# Patient Record
Sex: Male | Born: 1955 | Race: White | Hispanic: No | Smoking: Never smoker
Health system: Southern US, Community
[De-identification: ages and names within clinical notes are randomized; demographics above are authoritative.]

## PROBLEM LIST (undated history)

## (undated) DIAGNOSIS — Z8619 Personal history of other infectious and parasitic diseases: Secondary | ICD-10-CM

## (undated) DIAGNOSIS — N23 Unspecified renal colic: Secondary | ICD-10-CM

## (undated) DIAGNOSIS — R7303 Prediabetes: Secondary | ICD-10-CM

## (undated) DIAGNOSIS — M1990 Unspecified osteoarthritis, unspecified site: Secondary | ICD-10-CM

## (undated) DIAGNOSIS — E785 Hyperlipidemia, unspecified: Secondary | ICD-10-CM

## (undated) DIAGNOSIS — I10 Essential (primary) hypertension: Secondary | ICD-10-CM

## (undated) DIAGNOSIS — N189 Chronic kidney disease, unspecified: Secondary | ICD-10-CM

## (undated) DIAGNOSIS — K5790 Diverticulosis of intestine, part unspecified, without perforation or abscess without bleeding: Secondary | ICD-10-CM

## (undated) DIAGNOSIS — G4730 Sleep apnea, unspecified: Secondary | ICD-10-CM

## (undated) DIAGNOSIS — K219 Gastro-esophageal reflux disease without esophagitis: Secondary | ICD-10-CM

## (undated) DIAGNOSIS — N200 Calculus of kidney: Secondary | ICD-10-CM

## (undated) HISTORY — DX: Diverticulosis of intestine, part unspecified, without perforation or abscess without bleeding: K57.90

## (undated) HISTORY — DX: Calculus of kidney: N20.0

## (undated) HISTORY — DX: Essential (primary) hypertension: I10

## (undated) HISTORY — DX: Personal history of other infectious and parasitic diseases: Z86.19

## (undated) HISTORY — DX: Hyperlipidemia, unspecified: E78.5

## (undated) HISTORY — DX: Gastro-esophageal reflux disease without esophagitis: K21.9

## (undated) HISTORY — DX: Prediabetes: R73.03

## (undated) HISTORY — PX: WISDOM TOOTH EXTRACTION: SHX21

## (undated) HISTORY — DX: Unspecified osteoarthritis, unspecified site: M19.90

---
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
## 2008-04-20 ENCOUNTER — Emergency Department (HOSPITAL_COMMUNITY): Admission: EM | Admit: 2008-04-20 | Discharge: 2008-04-20 | Payer: Self-pay | Admitting: Emergency Medicine

## 2008-06-05 ENCOUNTER — Emergency Department: Payer: Self-pay | Admitting: Internal Medicine

## 2008-09-17 ENCOUNTER — Emergency Department: Payer: Self-pay | Admitting: Emergency Medicine

## 2009-01-19 HISTORY — PX: LITHOTRIPSY: SUR834

## 2009-02-14 DIAGNOSIS — E7800 Pure hypercholesterolemia, unspecified: Secondary | ICD-10-CM | POA: Insufficient documentation

## 2009-03-21 ENCOUNTER — Emergency Department: Payer: Self-pay | Admitting: Unknown Physician Specialty

## 2009-03-25 ENCOUNTER — Ambulatory Visit (HOSPITAL_COMMUNITY): Admission: AD | Admit: 2009-03-25 | Discharge: 2009-03-25 | Payer: Self-pay | Admitting: Urology

## 2009-03-26 DIAGNOSIS — N200 Calculus of kidney: Secondary | ICD-10-CM | POA: Insufficient documentation

## 2009-03-26 DIAGNOSIS — G4733 Obstructive sleep apnea (adult) (pediatric): Secondary | ICD-10-CM | POA: Insufficient documentation

## 2009-05-06 ENCOUNTER — Ambulatory Visit: Payer: Self-pay | Admitting: Gastroenterology

## 2009-05-06 LAB — HM COLONOSCOPY

## 2009-08-22 ENCOUNTER — Ambulatory Visit (HOSPITAL_COMMUNITY): Admission: RE | Admit: 2009-08-22 | Discharge: 2009-08-22 | Payer: Self-pay | Admitting: Urology

## 2012-02-13 ENCOUNTER — Emergency Department: Payer: Self-pay | Admitting: Emergency Medicine

## 2012-02-13 LAB — URINALYSIS, COMPLETE
Bilirubin,UR: NEGATIVE
Glucose,UR: NEGATIVE mg/dL (ref 0–75)
Ketone: NEGATIVE
Leukocyte Esterase: NEGATIVE
Protein: 30
RBC,UR: 1479 /HPF (ref 0–5)
Specific Gravity: 1.018 (ref 1.003–1.030)
Squamous Epithelial: NONE SEEN

## 2012-02-13 LAB — BASIC METABOLIC PANEL
Anion Gap: 7 (ref 7–16)
Chloride: 106 mmol/L (ref 98–107)
Co2: 24 mmol/L (ref 21–32)
Creatinine: 1.01 mg/dL (ref 0.60–1.30)
EGFR (African American): >60
EGFR (Non-African Amer.): >60
Glucose: 112 mg/dL — ABNORMAL HIGH (ref 65–99)
Osmolality: 276 (ref 275–301)
Potassium: 3.9 mmol/L (ref 3.5–5.1)

## 2012-02-13 LAB — CBC
HCT: 48.1 % (ref 40.0–52.0)
HGB: 15.6 g/dL (ref 13.0–18.0)
MCH: 25.7 pg — ABNORMAL LOW (ref 26.0–34.0)
MCHC: 32.4 g/dL (ref 32.0–36.0)
MCV: 79 fL — ABNORMAL LOW (ref 80–100)
Platelet: 241 10*3/uL (ref 150–440)
RBC: 6.07 10*6/uL — ABNORMAL HIGH (ref 4.40–5.90)
RDW: 13.6 % (ref 11.5–14.5)
WBC: 14.7 10*3/uL — ABNORMAL HIGH (ref 3.8–10.6)

## 2012-02-15 ENCOUNTER — Other Ambulatory Visit: Payer: Self-pay | Admitting: Urology

## 2012-02-16 ENCOUNTER — Encounter (HOSPITAL_COMMUNITY): Payer: Self-pay | Admitting: *Deleted

## 2012-02-16 NOTE — Pre-Procedure Instructions (Signed)
Asked to bring blue folder the day of the procedure,insurance card,I.D. driver's license,wear comfortable clothing and have a driver for the day. Asked not to take Advil,Motrin,Ibuprofen,Aleve or any NSAIDS, Aspirin, or Toradol for 72 hours prior to procedure,  No vitamins or herbal medications 7 days prior to procedure. Instructed to take laxative per doctor's office instructions and eat a light dinner the evening before procedure.   To arrive at1415  for lithotripsy procedure.  

## 2012-02-19 ENCOUNTER — Encounter (HOSPITAL_COMMUNITY): Payer: Self-pay | Admitting: Pharmacist

## 2012-02-21 MED ORDER — CIPROFLOXACIN HCL 500 MG PO TABS
500.0000 mg | ORAL_TABLET | ORAL | Status: AC
  Administered 2012-02-22: 500 mg via ORAL
  Filled 2012-02-21: qty 1

## 2012-02-22 ENCOUNTER — Ambulatory Visit (HOSPITAL_COMMUNITY): Payer: Managed Care, Other (non HMO)

## 2012-02-22 ENCOUNTER — Ambulatory Visit (HOSPITAL_COMMUNITY)
Admission: RE | Admit: 2012-02-22 | Discharge: 2012-02-22 | Disposition: A | Discharge status: Home / Self Care | Payer: Managed Care, Other (non HMO) | Source: Ambulatory Visit | Attending: Urology | Admitting: Urology

## 2012-02-22 ENCOUNTER — Encounter (HOSPITAL_COMMUNITY)
Admission: RE | Disposition: A | Discharge status: Home / Self Care | Payer: Self-pay | Source: Ambulatory Visit | Attending: Urology

## 2012-02-22 ENCOUNTER — Encounter (HOSPITAL_COMMUNITY): Payer: Self-pay | Admitting: *Deleted

## 2012-02-22 DIAGNOSIS — N201 Calculus of ureter: Secondary | ICD-10-CM | POA: Insufficient documentation

## 2012-02-22 DIAGNOSIS — G4730 Sleep apnea, unspecified: Secondary | ICD-10-CM | POA: Insufficient documentation

## 2012-02-22 DIAGNOSIS — K219 Gastro-esophageal reflux disease without esophagitis: Secondary | ICD-10-CM | POA: Insufficient documentation

## 2012-02-22 DIAGNOSIS — E7800 Pure hypercholesterolemia, unspecified: Secondary | ICD-10-CM | POA: Insufficient documentation

## 2012-02-22 HISTORY — DX: Unspecified renal colic: N23

## 2012-02-22 HISTORY — DX: Sleep apnea, unspecified: G47.30

## 2012-02-22 HISTORY — DX: Chronic kidney disease, unspecified: N18.9

## 2012-02-22 SURGERY — LITHOTRIPSY, ESWL
Anesthesia: LOCAL | Laterality: Right

## 2012-02-22 MED ORDER — DIPHENHYDRAMINE HCL 25 MG PO CAPS
25.0000 mg | ORAL_CAPSULE | ORAL | Status: AC
  Administered 2012-02-22: 25 mg via ORAL
  Filled 2012-02-22: qty 1

## 2012-02-22 MED ORDER — SODIUM CHLORIDE 0.9 % IJ SOLN: 3.0000 mL | INTRAMUSCULAR | Status: DC | PRN

## 2012-02-22 MED ORDER — ACETAMINOPHEN 325 MG PO TABS: 650.0000 mg | ORAL_TABLET | ORAL | Status: DC | PRN

## 2012-02-22 MED ORDER — SODIUM CHLORIDE 0.9 % IV SOLN: 250.0000 mL | INTRAVENOUS | Status: DC | PRN

## 2012-02-22 MED ORDER — OXYCODONE HCL 5 MG PO TABS: 5.0000 mg | ORAL_TABLET | ORAL | Status: DC | PRN

## 2012-02-22 MED ORDER — DIAZEPAM 5 MG PO TABS
10.0000 mg | ORAL_TABLET | ORAL | Status: AC
  Administered 2012-02-22: 10 mg via ORAL
  Filled 2012-02-22: qty 2

## 2012-02-22 MED ORDER — DEXTROSE-NACL 5-0.45 % IV SOLN
INTRAVENOUS | Status: DC
  Administered 2012-02-22: 1000 mL via INTRAVENOUS

## 2012-02-22 MED ORDER — ONDANSETRON HCL 4 MG/2ML IJ SOLN: 4.0000 mg | Freq: Four times a day (QID) | INTRAMUSCULAR | Status: DC | PRN

## 2012-02-22 MED ORDER — FENTANYL CITRATE 0.05 MG/ML IJ SOLN: 25.0000 ug | INTRAMUSCULAR | Status: DC | PRN

## 2012-02-22 MED ORDER — ACETAMINOPHEN 650 MG RE SUPP
650.0000 mg | RECTAL | Status: DC | PRN
  Filled 2012-02-22: qty 1

## 2012-02-22 MED ORDER — SODIUM CHLORIDE 0.9 % IJ SOLN: 3.0000 mL | Freq: Two times a day (BID) | INTRAMUSCULAR | Status: DC

## 2012-02-22 NOTE — H&P (Signed)
ctive Problems Problems  1. Nephrolithiasis Of Both Kidneys 592.0 2. Ureteral Stone Right 592.1  History of Present Illness  Tyrone Wang is a former patient with a history of stones.  He had right ESWL in 2011 and was stone free after therapy.  He returns now with the onset Saturday morning of severe right flank pain.  He had gross hematuria 3 days prior to the pain.  He has had no voiding symptoms.  He passed a stone a couple of months ago, but only a few small bits prior to that.  He was seen in the [place] ER and was found to have a 6 mm stone on the CT which he brought today.  He continues to have pain.   Past Medical History Problems  1. History of  Adult Sleep Apnea 780.57 2. History of  Arthritis V13.4 3. History of  Esophageal Reflux 530.81 4. History of  Hypercholesterolemia 272.0 5. History of  Nephrolithiasis V13.01  Surgical History Problems  1. History of  Lithotripsy 2. History of  Lithotripsy 3. History of  Lithotripsy  Current Meds 1. Fish Oil CAPS; Therapy: (Recorded:25Apr2011) to 2. Multi-Vitamin TABS; Therapy: (Recorded:25Apr2011) to 3. Percocet TABS; Therapy: (Recorded:27Jan2014) to  Allergies Medication  1. No Known Drug Allergies  Family History Problems  1. Maternal history of  Blood In Urine 2. Fraternal history of  Family Health Status - Father's Age age 69 3. Fraternal history of  Family Health Status - Mother's Age age 2 4. Maternal history of  Nephrolithiasis 5. Fraternal history of  Nephrolithiasis Denied  6. Fraternal history of  Family Health Status Number Of Children  Social History Problems  1. Marital History - Divorced V61.03 2. Never A Smoker 3. 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 History of  Alcohol Use 5. History of  Caffeine Use 6. History of  Tobacco Use  Review of Systems Genitourinary, constitutional, skin, eye, otolaryngeal, hematologic/lymphatic, cardiovascular, pulmonary, endocrine, musculoskeletal,  gastrointestinal, neurological and psychiatric system(s) were reviewed and pertinent findings if present are noted.  Genitourinary: hematuria.  Gastrointestinal: nausea, flank pain and heartburn.  Constitutional: fever (low grade. ).    Vitals Vital Signs [Data Includes: Last 1 Day]  27Jan2014 02:31PM  BMI Calculated: 27.4 BSA Calculated: 2 Height: 5 ft 9 in Weight: 185 lb  Blood Pressure: 153 / 98 Temperature: 99.1 F Heart Rate: 80  Physical Exam Constitutional: Well nourished and well developed . No acute distress.  Pulmonary: No respiratory distress and normal respiratory rhythm and effort.  Cardiovascular: Heart rate and rhythm are normal . No peripheral edema.  Abdomen: The abdomen is soft and nontender. No masses are palpated. Moderate tenderness in the RLQ is present. moderate right CVA tenderness no CVA tenderness. No hernias are palpable. No hepatosplenomegaly noted.    Results/Data Urine [Data Includes: Last 1 Day]   27Jan2014  COLOR YELLOW   APPEARANCE CLEAR   SPECIFIC GRAVITY 1.010   pH 6.5   GLUCOSE NEG mg/dL  BILIRUBIN NEG   KETONE NEG mg/dL  BLOOD SMALL   PROTEIN NEG mg/dL  UROBILINOGEN 0.2 mg/dL  NITRITE NEG   LEUKOCYTE ESTERASE NEG   SQUAMOUS EPITHELIAL/HPF NONE SEEN   WBC 0-2 WBC/hpf  RBC 7-10 RBC/hpf  BACTERIA NONE SEEN   CRYSTALS NONE SEEN   CASTS NONE SEEN    The following images/tracing/specimen were independently visualized:  I have reviewed his CT films from the ER. His KUB today shows no change in the position of the left proximal ureteral stone. He  has LUP stones but the right renal stones are not readily apparent. The film is otherwise unremarkable.    Assessment Assessed  1. Ureteral Stone Right 592.1 2. Nephrolithiasis Of Both Kidneys 592.0   He has bilateral renal stones with a symptomatic 6mm right proximal stone.   Plan Health Maintenance (V70.0)  1. UA With REFLEX  Done: 27Jan2014 02:08PM Ureteral Stone (592.1)  2.  HYDROmorphone HCl 2 MG Oral Tablet; One to two po q 4-6 hours prn pain; Therapy:  27Jan2014 to (Evaluate:03Feb2014); Last Rx:27Jan2014 3. Morphine Sulfate 15 MG/ML Injection Solution; INJECT 10  MG Intramuscular; To Be Done:  27Jan2014; Status: HOLD FOR - Administration 4. Promethazine HCl 25 MG Rectal Suppository; INSERT 1 SUPP Every  6 hours PRN; Therapy:  27Jan2014 to (Last Rx:27Jan2014) 5. Promethazine HCl 25 MG/ML Injection Solution; INJECT 25 MG Intramuscular; To Be Done:  27Jan2014; Status: HOLD FOR - Administration 6. KUB  Done: 27Jan2014 12:00AM 7. Follow-up Schedule Surgery Office  Follow-up  Requested for: 27Jan2014   He got relief with morphine 10mg  and phenergan 25mg  IM today and would like to avoid a stent. I am going to give him dilaudid and phenergan suppositories and after discussing the options will set him up for ESWL later this week.  I reviewed the risks in detail and he is quite familiar with them.

## 2012-10-07 ENCOUNTER — Ambulatory Visit: Payer: Self-pay | Admitting: Gastroenterology

## 2012-11-25 ENCOUNTER — Ambulatory Visit: Payer: Self-pay | Admitting: Gastroenterology

## 2012-11-25 HISTORY — PX: UPPER GASTROINTESTINAL ENDOSCOPY: SHX188

## 2012-11-28 LAB — PATHOLOGY REPORT

## 2013-06-21 ENCOUNTER — Ambulatory Visit: Payer: Self-pay | Admitting: Family Medicine

## 2014-01-25 IMAGING — CT CT ABD-PELV W/O CM
1 of 2 series · 15 of 32 positions shown, 19 images · non-contrast
Comparison: none

REASON FOR EXAM: abd pain   bloating  left kidney stone ORAL CONTRAST
COMMENTS:

[Series 2: abd 3mm wo 3.0 i40f 3 · axial · 0.76mm/px · z∈[-1083,-684]mm · 15 of 147 slices shown, 19 images]
[im 7/147  soft-tissue]
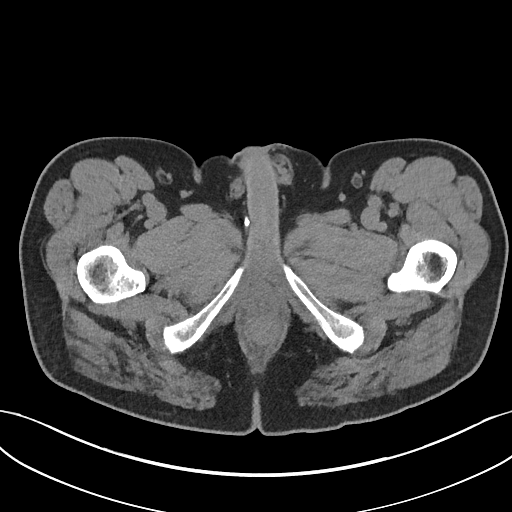
[im 7/147  bone]
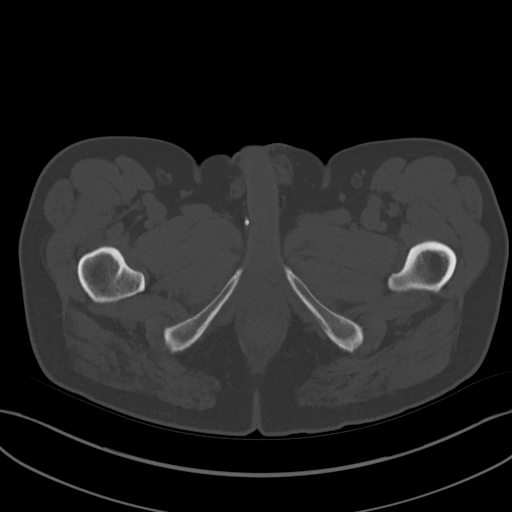
[im 19/147  soft-tissue]
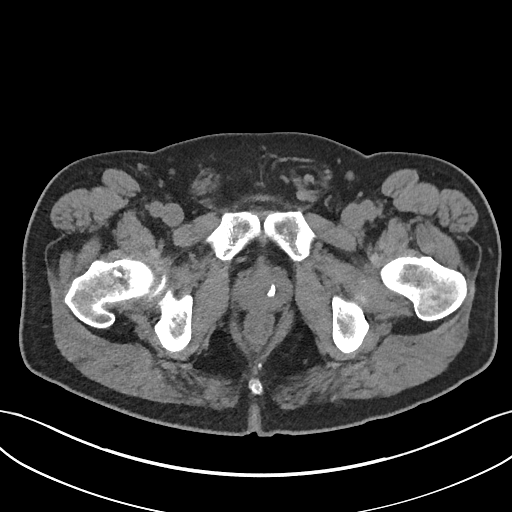
[im 31/147  soft-tissue]
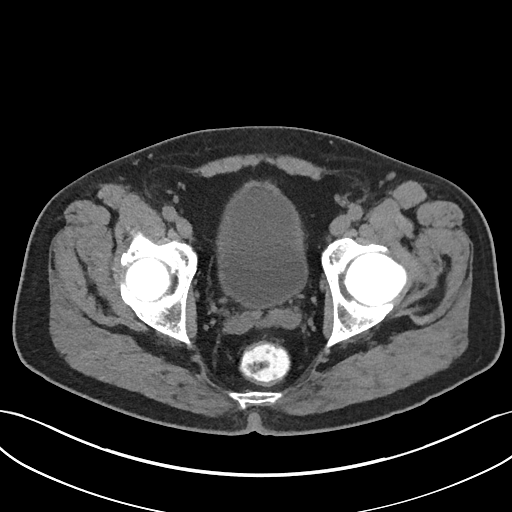
[im 43/147  soft-tissue]
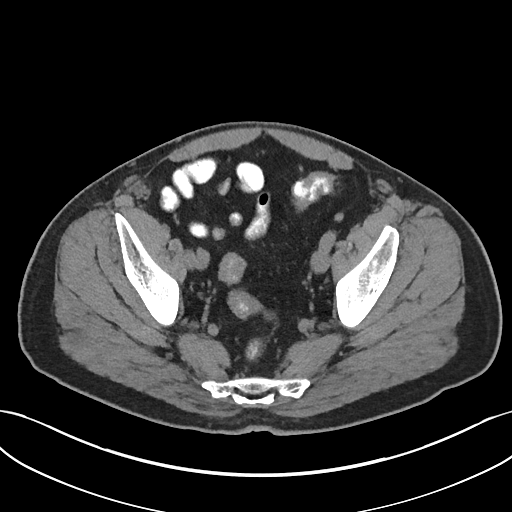
[im 49/147  soft-tissue]
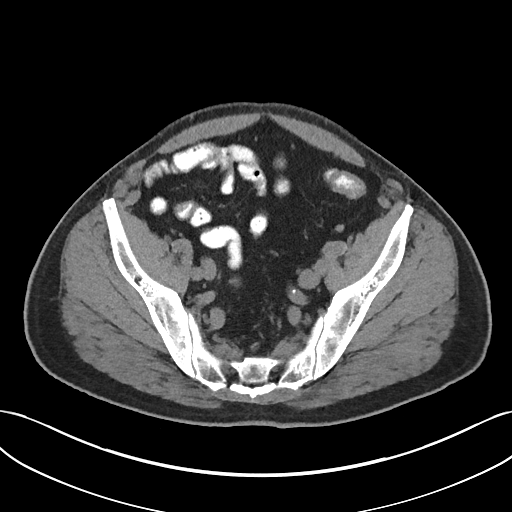
[im 61/147  soft-tissue]
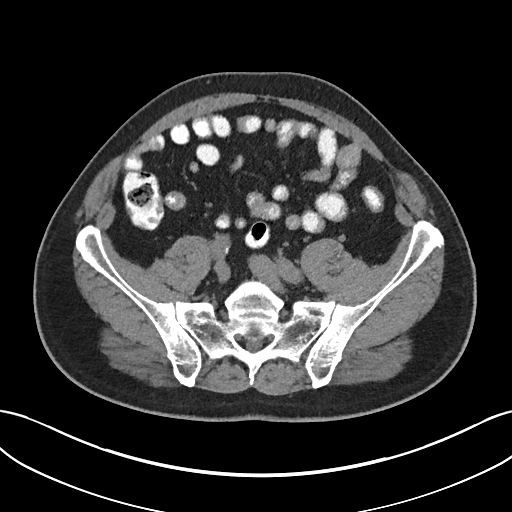
[im 74/147  soft-tissue]
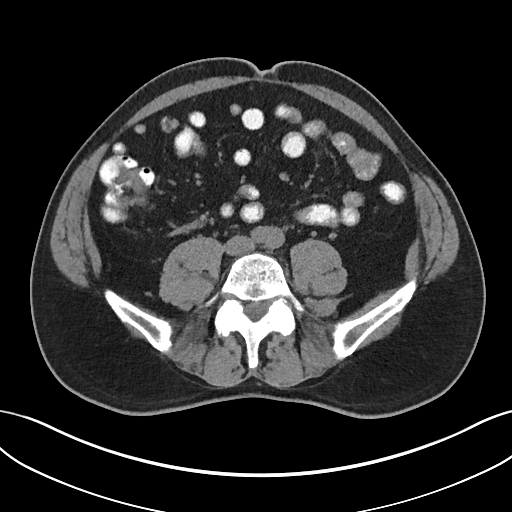
[im 86/147  soft-tissue]
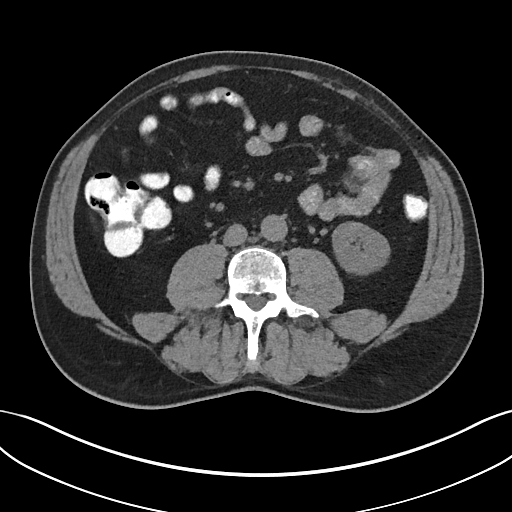
[im 98/147  soft-tissue]
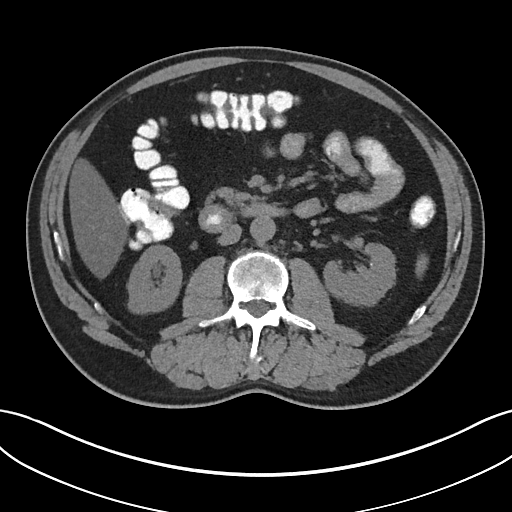
[im 98/147  bone]
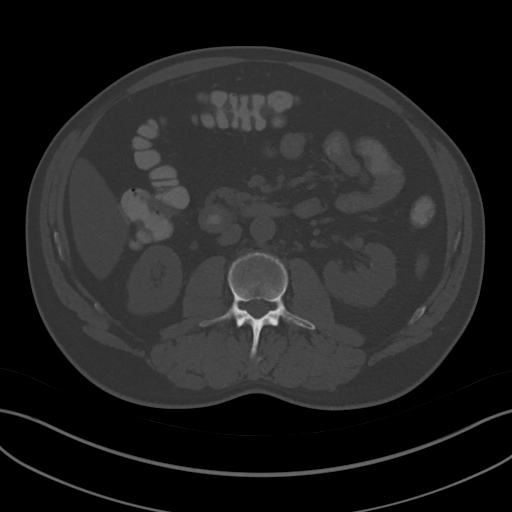
[im 104/147  soft-tissue]
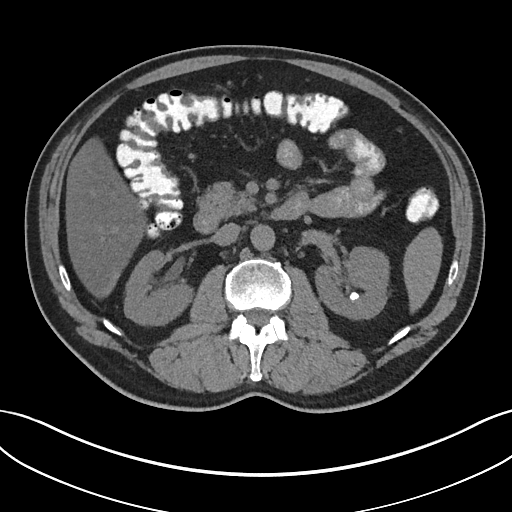
[im 116/147  soft-tissue]
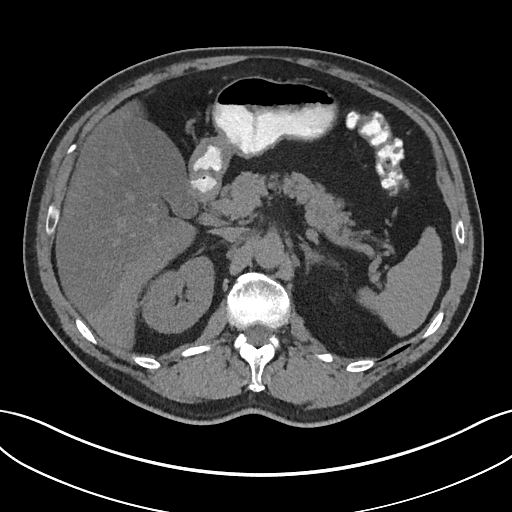
[im 122/147  lung]
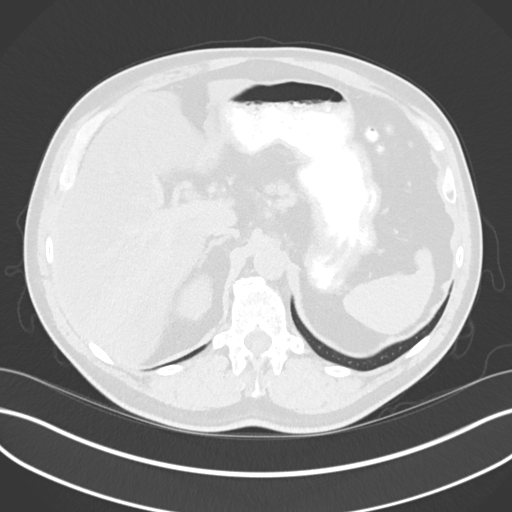
[im 128/147  soft-tissue]
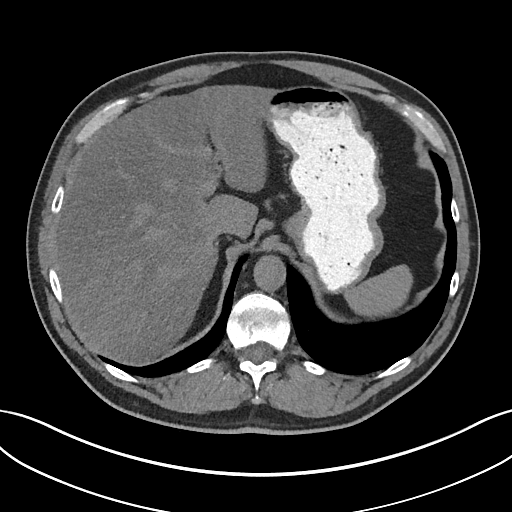
[im 128/147  lung]
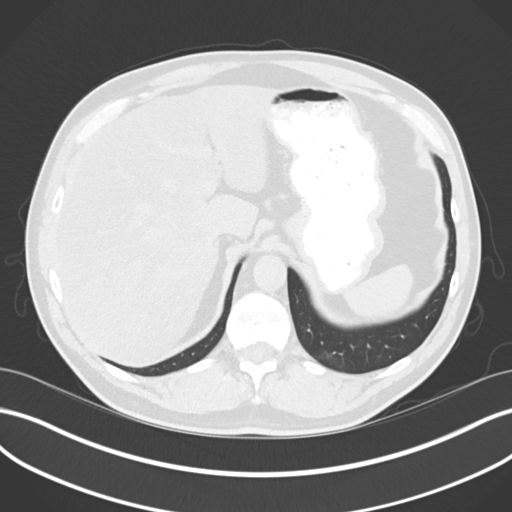
[im 134/147  lung]
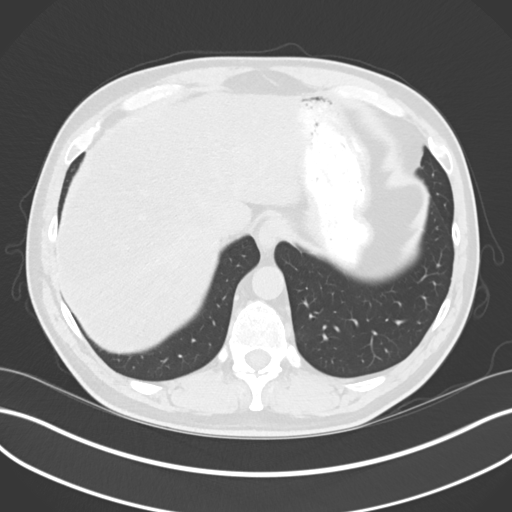
[im 140/147  soft-tissue]
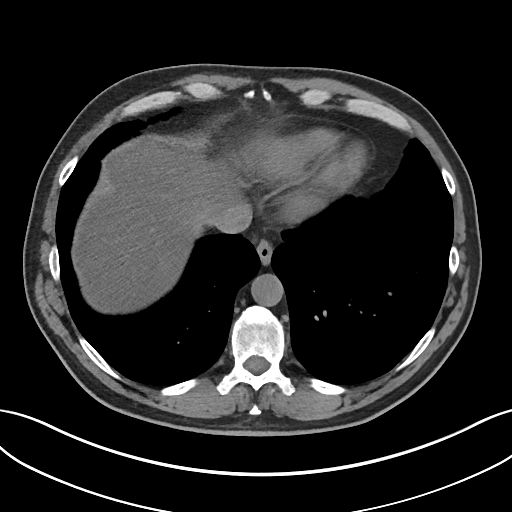
[im 140/147  lung]
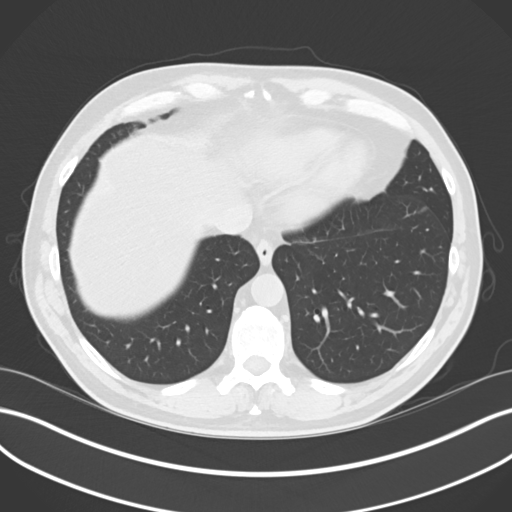

[15 of 32 positions shown; findings below may reference images not displayed]

PROCEDURE:     KCT - KCT ABDOMEN/PELVIS WO  - October 07, 2012  [DATE]

RESULT:     A CT of the abdomen and pelvis with oral contrast only is
reconstructed at 3.0 mm slice thickness in the axial plane. Comparison is
made to previous study without contrast on 02/13/2012 and to an examination
dated 03/21/2009.

Images through the base the lungs demonstrate normal aeration. Bilateral
nephrolithiasis is present without obstruction. Hepatic steatosis is
demonstrated. No radiopaque gallstones are evident. The liver and spleen
otherwise appear unremarkable. The pancreas, adrenal glands and abdominal
aorta appear to be within normal limits. There is no ascites or abnormal
fluid collection. Prostate calcification is present. The urinary bladder
appears unremarkable. The colon is nondistended. The wall of the sigmoid
colon appears mildly thickened there is no surrounding inflammation. This
may be artifactual given the nondistended state. There does not appear to be
significant colonic diverticulosis. A normal appearing appendix is present.
The small bowel is unremarkable. There is no adenopathy. The abdominal wall
appears intact.
IMPRESSION: Artifact versus mild thickening of the wall of the sigmoid
colon. Bilateral nephrolithiasis without hydronephrosis. Hepatic steatosis.

[REDACTED]

## 2015-07-26 ENCOUNTER — Encounter: Payer: Self-pay | Admitting: Family Medicine

## 2015-07-26 ENCOUNTER — Ambulatory Visit
Admission: RE | Admit: 2015-07-26 | Discharge: 2015-07-26 | Disposition: A | Discharge status: Home / Self Care | Payer: Managed Care, Other (non HMO) | Source: Ambulatory Visit | Attending: Family Medicine | Admitting: Family Medicine

## 2015-07-26 ENCOUNTER — Telehealth: Payer: Self-pay

## 2015-07-26 ENCOUNTER — Other Ambulatory Visit
Admission: RE | Admit: 2015-07-26 | Discharge: 2015-07-26 | Disposition: A | Discharge status: Home / Self Care | Payer: Managed Care, Other (non HMO) | Source: Ambulatory Visit | Attending: *Deleted | Admitting: *Deleted

## 2015-07-26 ENCOUNTER — Ambulatory Visit (INDEPENDENT_AMBULATORY_CARE_PROVIDER_SITE_OTHER): Payer: Managed Care, Other (non HMO) | Admitting: Family Medicine

## 2015-07-26 VITALS — BP 124/90 | HR 101 | Temp 100.9°F | Resp 16 | Wt 193.0 lb

## 2015-07-26 DIAGNOSIS — R509 Fever, unspecified: Secondary | ICD-10-CM | POA: Insufficient documentation

## 2015-07-26 DIAGNOSIS — N200 Calculus of kidney: Secondary | ICD-10-CM | POA: Insufficient documentation

## 2015-07-26 DIAGNOSIS — R51 Headache: Secondary | ICD-10-CM

## 2015-07-26 DIAGNOSIS — R52 Pain, unspecified: Secondary | ICD-10-CM | POA: Diagnosis not present

## 2015-07-26 DIAGNOSIS — Z889 Allergy status to unspecified drugs, medicaments and biological substances status: Secondary | ICD-10-CM | POA: Insufficient documentation

## 2015-07-26 DIAGNOSIS — E119 Type 2 diabetes mellitus without complications: Secondary | ICD-10-CM | POA: Insufficient documentation

## 2015-07-26 DIAGNOSIS — R059 Cough, unspecified: Secondary | ICD-10-CM

## 2015-07-26 DIAGNOSIS — G8929 Other chronic pain: Secondary | ICD-10-CM | POA: Insufficient documentation

## 2015-07-26 DIAGNOSIS — M1990 Unspecified osteoarthritis, unspecified site: Secondary | ICD-10-CM | POA: Insufficient documentation

## 2015-07-26 DIAGNOSIS — R05 Cough: Secondary | ICD-10-CM

## 2015-07-26 DIAGNOSIS — E1169 Type 2 diabetes mellitus with other specified complication: Secondary | ICD-10-CM | POA: Insufficient documentation

## 2015-07-26 DIAGNOSIS — G4700 Insomnia, unspecified: Secondary | ICD-10-CM | POA: Insufficient documentation

## 2015-07-26 DIAGNOSIS — I10 Essential (primary) hypertension: Secondary | ICD-10-CM | POA: Insufficient documentation

## 2015-07-26 DIAGNOSIS — Z8619 Personal history of other infectious and parasitic diseases: Secondary | ICD-10-CM | POA: Insufficient documentation

## 2015-07-26 LAB — COMPREHENSIVE METABOLIC PANEL
ALT: 42 U/L (ref 17–63)
AST: 46 U/L — AB (ref 15–41)
Albumin: 4.1 g/dL (ref 3.5–5.0)
Alkaline Phosphatase: 59 U/L (ref 38–126)
Anion gap: 11 (ref 5–15)
BUN: 16 mg/dL (ref 6–20)
CHLORIDE: 97 mmol/L — AB (ref 101–111)
CO2: 25 mmol/L (ref 22–32)
Calcium: 9 mg/dL (ref 8.9–10.3)
Creatinine, Ser: 1.02 mg/dL (ref 0.61–1.24)
GFR calc Af Amer: >60 mL/min (ref >60)
Glucose, Bld: 133 mg/dL — ABNORMAL HIGH (ref 65–99)
POTASSIUM: 4.1 mmol/L (ref 3.5–5.1)
SODIUM: 133 mmol/L — AB (ref 135–145)
Total Bilirubin: 1.8 mg/dL — ABNORMAL HIGH (ref 0.3–1.2)
Total Protein: 7.8 g/dL (ref 6.5–8.1)

## 2015-07-26 LAB — POCT URINALYSIS DIPSTICK
Bilirubin, UA: NEGATIVE
Glucose, UA: NEGATIVE
Leukocytes, UA: NEGATIVE
Nitrite, UA: NEGATIVE
PH UA: 6
SPEC GRAV UA: 1.02
UROBILINOGEN UA: 0.2

## 2015-07-26 LAB — CBC WITH DIFFERENTIAL/PLATELET
Basophils Absolute: 0.1 10*3/uL (ref 0–0.1)
Basophils Relative: 0 %
EOS PCT: 0 %
Eosinophils Absolute: 0 10*3/uL (ref 0–0.7)
HCT: 47 % (ref 40.0–52.0)
HEMOGLOBIN: 15.6 g/dL (ref 13.0–18.0)
LYMPHS ABS: 0.9 10*3/uL — AB (ref 1.0–3.6)
Lymphocytes Relative: 7 %
MCH: 25.9 pg — AB (ref 26.0–34.0)
MCHC: 33.1 g/dL (ref 32.0–36.0)
MCV: 78.2 fL — AB (ref 80.0–100.0)
Monocytes Absolute: 1.6 10*3/uL — ABNORMAL HIGH (ref 0.2–1.0)
Monocytes Relative: 13 %
NEUTROS PCT: 80 %
Neutro Abs: 9.7 10*3/uL — ABNORMAL HIGH (ref 1.4–6.5)
Platelets: 189 10*3/uL (ref 150–440)
RBC: 6.01 MIL/uL — AB (ref 4.40–5.90)
RDW: 13.6 % (ref 11.5–14.5)
WBC: 12.3 10*3/uL — AB (ref 3.8–10.6)

## 2015-07-26 LAB — POCT INFLUENZA A/B
INFLUENZA A, POC: NEGATIVE
Influenza B, POC: NEGATIVE

## 2015-07-26 MED ORDER — AMOXICILLIN-POT CLAVULANATE 500-125 MG PO TABS: 1.0000 | ORAL_TABLET | Freq: Two times a day (BID) | ORAL | Status: AC

## 2015-07-26 MED ORDER — DOXYCYCLINE HYCLATE 100 MG PO TABS: 100.0000 mg | ORAL_TABLET | Freq: Two times a day (BID) | ORAL | Status: DC

## 2015-07-26 NOTE — Telephone Encounter (Signed)
-----   Message from Malva Limesonald E Fisher, MD sent at 07/26/2015  5:10 PM EDT ----- No pneumonia on chest xr. White blood cell count is mildly elevated consistent with infection. Have sent prescription for doxycycline and augmentin to pharmacy to cover for possible tick born infections and respiratory infections. Should feel much better over the weekend. Drink lots of fluids. Go to ER if gets any worse over the weekend.

## 2015-07-26 NOTE — Progress Notes (Signed)
Patient: Tyrone Wang Male    DOB: Oct 12, 1955   60 y.o.   MRN: [id number] Visit Date: 07/26/2015  Today's Provider: Mila Merryonald Fisher, MD   Chief Complaint  Patient presents with  . Cough  . Fever   Subjective:    Fever  This is a new problem. The current episode started in the past 7 days (3 days). The problem occurs constantly. The problem has been gradually worsening. The maximum temperature noted was 103 to 103.9 F. The temperature was taken using a tympanic thermometer. Associated symptoms include chest pain, coughing, ear pain, headaches, muscle aches, nausea and a sore throat. Pertinent negatives include no abdominal pain, congestion, diarrhea, rash, sleepiness, urinary pain, vomiting or wheezing. He has tried NSAIDs (ibuprofen, advil, aleve) for the symptoms. The treatment provided mild relief.    Fever started Tuesday 07/23/2015 followed by cough, body aches, headaches, nausea and malaise. Has taken otc ibuprofen, advil, and aleve with no relief from body aches, but does bring temperature down for a hours..Fever has been up to 104 at times. Cough has been non-productive. Throat is a little scratchy. No dyspnea. No abdominal or flank pain. No changes in mental status. No neck pain. No visual disturbances. No rash. No known exposures to infectious diseases. No known tick bites. No recent travel or unusual foods.    No Known Allergies Current Meds  Medication Sig  . acyclovir cream (ZOVIRAX) 5 % Apply 1 application topically 5 (five) times daily.  Marland Kitchen. HYDROmorphone (DILAUDID) 2 MG tablet Take 1-2 mg by mouth every 4 (four) hours as needed. Pain  . Omega-3 Fatty Acids (FISH OIL) 1200 MG CAPS Take 2 capsules by mouth 2 (two) times daily.    Review of Systems  Constitutional: Positive for fever. Negative for chills and appetite change.  HENT: Positive for ear pain and sore throat. Negative for congestion.   Respiratory: Positive for cough. Negative for chest tightness, shortness  of breath and wheezing.   Cardiovascular: Positive for chest pain. Negative for palpitations.  Gastrointestinal: Positive for nausea. Negative for vomiting, abdominal pain and diarrhea.  Genitourinary: Negative for dysuria.  Musculoskeletal: Positive for myalgias and back pain.  Skin: Negative for rash.  Neurological: Positive for headaches.    Social History  Substance Use Topics  . Smoking status: Never Smoker   . Smokeless tobacco: Never Used  . Alcohol Use: Yes   Objective:   BP 124/90 mmHg  Pulse 101  Temp(Src) 100.9 F (38.3 C) (Oral)  Resp 16  Wt 193 lb (87.544 kg)  SpO2 95%  Physical Exam   General Appearance:    Alert, cooperative, no distress  ENT:   NP/OP pink and clear. No neck or spine tenderness. Eyes clear  Abd:   Soft/non tender, no masses, no CVAT  Eyes:    PERRL, conjunctiva/corneas clear, EOM's intact       Lungs:     Faint crackles at bases, respirations unlabored  Heart:    Regular rate and rhythm  Neurologic:   Awake, alert, oriented x 3. No apparent focal neurological           defect. Negative Kernig's. Negative Brudzinski.   Skin:   No lesions.      Results for orders placed or performed in visit on 07/26/15  POCT urinalysis dipstick  Result Value Ref Range   Color, UA Amber    Clarity, UA Cloudy    Glucose, UA Neg    Bilirubin, UA Neg  Ketones, UA Large/80    Spec Grav, UA 1.020    Blood, UA Moderate    pH, UA 6.0    Protein, UA 100++    Urobilinogen, UA 0.2    Nitrite, UA Neg    Leukocytes, UA Negative Negative  POCT Influenza A/B  Result Value Ref Range   Influenza A, POC Negative Negative   Influenza B, POC Negative Negative      Dg Chest 2 View  07/26/2015  CLINICAL DATA:  Fever for 4 days.  Body aches. EXAM: CHEST  2 VIEW COMPARISON:  None. FINDINGS: Linear scarring at the right base. Left lung clear. Heart is normal size. No effusions or acute bony abnormality. IMPRESSION: No active cardiopulmonary disease. Electronically  Signed   By: Charlett NoseKevin  Dover M.D.   On: 07/26/2015 16:55   Lab Results  Component Value Date   WBC 12.3* 07/26/2015   HGB 15.6 07/26/2015   HCT 47.0 07/26/2015   MCV 78.2* 07/26/2015   PLT 189 07/26/2015   CMP Latest Ref Rng 07/26/2015 02/13/2012  Glucose 65 - 99 mg/dL 161(W133(H) 960(A112(H)  BUN 6 - 20 mg/dL 16 17  Creatinine 5.400.61 - 1.24 mg/dL 9.811.02 1.911.01  Sodium 478135 - 145 mmol/L 133(L) 137  Potassium 3.5 - 5.1 mmol/L 4.1 3.9  Chloride 101 - 111 mmol/L 97(L) 106  CO2 22 - 32 mmol/L 25 24  Calcium 8.9 - 10.3 mg/dL 9.0 8.8  Total Protein 6.5 - 8.1 g/dL 7.8 -  Total Bilirubin 0.3 - 1.2 mg/dL 2.9(F1.8(H) -  Alkaline Phos 38 - 126 U/L 59 -  AST 15 - 41 U/L 46(H) -  ALT 17 - 63 U/L 42 -                 Assessment & Plan:     1. Fever, unspecified fever cause Likely viral syndrome or tick born infections. No sign of CNS involvement. He does have some faint crackles to lung auscultation. Cover for LRI. Culture urine. Push fluids. Go to ER if not improving on antibiotics or if any new symptoms develop.   - doxycycline (VIBRA-TABS) 100 MG tablet; Take 1 tablet (100 mg total) by mouth 2 (two) times daily.  Dispense: 20 tablet; Refill: 0 - amoxicillin-clavulanate (AUGMENTIN) 500-125 MG tablet; Take 1 tablet (500 mg total) by mouth 2 (two) times daily.  Dispense: 20 tablet; Refill: 0  2. Body aches  - POCT urinalysis dipstick - POCT Influenza A/B - Comprehensive metabolic panel - CBC with Differential/Platelet - Urine culture - DG Chest 2 View; Future  3. Cough  - DG Chest 2 View; Future  The entirety of the information documented in the History of Present Illness, Review of Systems and Physical Exam were personally obtained by me. Portions of this information were initially documented by April M. Hyacinth MeekerMiller, CMA and reviewed by me for thoroughness and accuracy.        Mila Merryonald Fisher, MD  [facility]

## 2015-07-26 NOTE — Telephone Encounter (Signed)
Pt advised.   Thanks,   -Laura  

## 2015-07-27 LAB — URINE CULTURE: ORGANISM ID, BACTERIA: NO GROWTH

## 2015-07-29 ENCOUNTER — Telehealth: Payer: Self-pay

## 2015-07-29 NOTE — Telephone Encounter (Signed)
Pt advised.  He reports is he feeling much better.  He is not running anymore fevers and is able to "get out and do stuff now".  He was wondering if he should have repeat blood work including being tested for lyme disease.  Please advised.   Thanks,   -Tyrone RiegerLaura

## 2015-07-29 NOTE — Telephone Encounter (Signed)
Yes. It takes a couple weeks for antibodies to show up in blood stream, so he should have Lyme disease titers in about 2 weeks. He should stay on doxycycline until we get results of titer. Call for refill when it runs out next week.

## 2015-07-29 NOTE — Telephone Encounter (Signed)
-----   Message from Donald E Fisher, MD sent at 07/29/2015  1:08 PM EDT ----- Please check with patient to make sure improved since started on antibiotic Friday. Urine cultures were negative, no sign of urinary tract infection. 

## 2015-07-29 NOTE — Telephone Encounter (Signed)
-----   Message from Malva Limesonald E Fisher, MD sent at 07/29/2015  1:08 PM EDT ----- Please check with patient to make sure improved since started on antibiotic Friday. Urine cultures were negative, no sign of urinary tract infection.

## 2015-07-30 NOTE — Telephone Encounter (Signed)
Tried calling patient. Left message to call back. 

## 2015-07-31 NOTE — Telephone Encounter (Signed)
Pt informed and voiced understanding of results. 

## 2015-08-05 ENCOUNTER — Telehealth: Payer: Self-pay | Admitting: Family Medicine

## 2015-08-05 DIAGNOSIS — R509 Fever, unspecified: Secondary | ICD-10-CM | POA: Insufficient documentation

## 2015-08-05 MED ORDER — AMOXICILLIN 500 MG PO CAPS: 500.0000 mg | ORAL_CAPSULE | Freq: Three times a day (TID) | ORAL | Status: DC

## 2015-08-05 NOTE — Telephone Encounter (Signed)
Pt contacted office for refill request on the following medications:  doxycycline (VIBRA-TABS) 100 MG tablet.  [facility],  [id number]/MW  Pt states he was told call back for a refill.  Pt states Saturday night he started itching all over and is asking if this was caused by the medication?

## 2015-08-05 NOTE — Telephone Encounter (Signed)
Patient stated that he is still itching, just not as bad as he was on Saturday.

## 2015-08-05 NOTE — Telephone Encounter (Signed)
Please review-aa 

## 2015-08-05 NOTE — Telephone Encounter (Signed)
Is he still having itching or has it gone away since Saturday?

## 2015-08-05 NOTE — Telephone Encounter (Signed)
LMOVM for pt to return call 

## 2015-08-05 NOTE — Telephone Encounter (Signed)
Change antibiotic to amoxicillin 531m one table three times a day for 10 days.  Should come in to labs sometime this week to check for lyme disease, cbc and met c. Have printed order for him to pick up.

## 2015-08-12 ENCOUNTER — Telehealth: Payer: Self-pay

## 2015-08-12 MED ORDER — DOXYCYCLINE HYCLATE 100 MG PO TABS: 100.0000 mg | ORAL_TABLET | Freq: Two times a day (BID) | ORAL | 0 refills | Status: DC

## 2015-08-12 NOTE — Telephone Encounter (Signed)
Patient called and states he rather not take Amox and take Doxy another round, he has been reading and talking to people and feels that this medication will be better if he has Lymes. Please review-aa

## 2016-02-18 ENCOUNTER — Encounter: Payer: Self-pay | Admitting: Family Medicine

## 2016-02-18 ENCOUNTER — Ambulatory Visit
Admission: RE | Admit: 2016-02-18 | Discharge: 2016-02-18 | Disposition: A | Discharge status: Home / Self Care | Payer: Managed Care, Other (non HMO) | Source: Ambulatory Visit | Attending: Family Medicine | Admitting: Family Medicine

## 2016-02-18 ENCOUNTER — Ambulatory Visit (INDEPENDENT_AMBULATORY_CARE_PROVIDER_SITE_OTHER): Payer: Managed Care, Other (non HMO) | Admitting: Family Medicine

## 2016-02-18 VITALS — BP 140/80 | HR 84 | Temp 98.2°F | Resp 16 | Wt 195.0 lb

## 2016-02-18 DIAGNOSIS — M542 Cervicalgia: Secondary | ICD-10-CM | POA: Insufficient documentation

## 2016-02-18 DIAGNOSIS — M5431 Sciatica, right side: Secondary | ICD-10-CM | POA: Diagnosis not present

## 2016-02-18 DIAGNOSIS — M47812 Spondylosis without myelopathy or radiculopathy, cervical region: Secondary | ICD-10-CM | POA: Diagnosis not present

## 2016-02-18 DIAGNOSIS — M5441 Lumbago with sciatica, right side: Secondary | ICD-10-CM | POA: Diagnosis not present

## 2016-02-18 DIAGNOSIS — N200 Calculus of kidney: Secondary | ICD-10-CM | POA: Insufficient documentation

## 2016-02-18 MED ORDER — PREDNISONE 10 MG PO TABS: ORAL_TABLET | ORAL | 0 refills | Status: AC

## 2016-02-18 MED ORDER — GABAPENTIN 300 MG PO CAPS: 300.0000 mg | ORAL_CAPSULE | Freq: Every day | ORAL | 0 refills | Status: DC

## 2016-02-18 NOTE — Patient Instructions (Signed)
Go [facility] [address] for  [facility]

## 2016-02-18 NOTE — Progress Notes (Signed)
Patient: Tyrone Wang Male    DOB: Jun 12, 1955   61 y.o.   MRN: [id number] Visit Date: 02/18/2016  Today's Provider: Mila Merryonald Fisher, MD   Chief Complaint  Patient presents with  . Back Pain  . Leg Pain   Subjective:    Patient states he has had lower back for a while. Pain has gotten worse the last couple of months. Pain radiates down right buttock and leg. Patient stated also he has left arm pain and numbness. Patient is not taking any medications for pain.    Back Pain  This is a new problem. The current episode started more than 1 year ago. The problem occurs constantly. The problem has been gradually worsening since onset. The pain is present in the lumbar spine. The quality of the pain is described as aching and cramping. The pain radiates to the right thigh and right knee. The pain is at a severity of 5/10. The pain is moderate. The pain is the same all the time. The symptoms are aggravated by position, lying down, standing, twisting and bending. Stiffness is present all day. Associated symptoms include leg pain, numbness and tingling. Pertinent negatives include no abdominal pain, bladder incontinence, bowel incontinence, chest pain, dysuria, fever, headaches, paresis, paresthesias, pelvic pain, perianal numbness, weakness or weight loss. He has tried home exercises and NSAIDs for the symptoms. The treatment provided mild relief.       No Known Allergies   Current Outpatient Prescriptions:  .  doxycycline (VIBRA-TABS) 100 MG tablet, Take 1 tablet (100 mg total) by mouth 2 (two) times daily., Disp: 20 tablet, Rfl: 0 .  metroNIDAZOLE (METROCREAM) 0.75 % cream, , Disp: , Rfl:  .  Omega-3 Fatty Acids (FISH OIL) 1200 MG CAPS, Take 2 capsules by mouth 2 (two) times daily. Reported on 07/26/2015, Disp: , Rfl:   Review of Systems  Constitutional: Negative for appetite change, chills, fever and weight loss.  Respiratory: Negative for chest tightness, shortness of breath and  wheezing.   Cardiovascular: Negative for chest pain and palpitations.  Gastrointestinal: Negative for abdominal pain, bowel incontinence, nausea and vomiting.  Genitourinary: Negative for bladder incontinence, dysuria and pelvic pain.  Musculoskeletal: Positive for back pain.  Neurological: Positive for tingling and numbness. Negative for weakness, headaches and paresthesias.    Social History  Substance Use Topics  . Smoking status: Never Smoker  . Smokeless tobacco: Never Used  . Alcohol use Yes   Objective:   BP 140/80 (BP Location: Right Arm, Patient Position: Sitting, Cuff Size: Large)   Pulse 84   Temp 98.2 F (36.8 C) (Oral)   Resp 16   Wt 195 lb (88.5 kg)   SpO2 96%   BMI 28.80 kg/m   Physical Exam   General Appearance:    Alert, cooperative, no distress  Eyes:    PERRL, conjunctiva/corneas clear, EOM's intact       Lungs:     Clear to auscultation bilaterally, respirations unlabored  Heart:    Regular rate and rhythm  Neurologic:   Awake, alert, oriented x 3. No apparent focal neurological           defect.   MS:  FROM of neck and spine. Muscle strength +5/5. Slight tenderness left trapezius. DTR's +2 and symmetric. No s.s deficits.    Dg Cervical Spine Complete  Result Date: 02/18/2016 CLINICAL DATA:  Injury.  EXAM: CERVICAL SPINE - COMPLETE 4+ VIEW  COMPARISON:  06/21/2013 .  FINDINGS:  Diffuse multilevel degenerative change. No acute bony abnormality identified. Pulmonary apices are clear.  IMPRESSION: Diffuse degenerative change.  No acute abnormality . Electronically Signed   By: Maisie Fus  Register   On: 02/18/2016 10:00   Dg Lumbar Spine Complete  Result Date: 02/18/2016 CLINICAL DATA:  Pain .  EXAM: LUMBAR SPINE - COMPLETE 4+ VIEW  COMPARISON:  10/25/2013 .  CT 10/07/2012  FINDINGS: Soft tissue structures are unremarkable. Bilateral nephrolithiasis again noted. 3 mm calcific density noted projected over the right ureter the level of L2. Stable pelvic  calcifications consistent phleboliths. No bowel distention. Stool noted throughout colon. Degenerative changes lumbar spine and both hips.  IMPRESSION: 1. 3 mm calcific density projected over the proximal right ureter. 2. Bilateral nephrolithiasis. Electronically Signed   By: Maisie Fus  Register   On: 02/18/2016 10:02       Assessment & Plan:     1. Acute midline low back pain with right-sided sciatica   2. Neck pain  - DG Cervical Spine Complete; Future  3. Sciatica of right side  - DG Lumbar Spine Complete; Future - predniSONE (DELTASONE) 10 MG tablet; 6 tablets for 2 days, then 5 for 2 days, then 4 for 2 days, then 3 for 2 days, then 2 for 2 days, then 1 for 2 days.  Dispense: 42 tablet; Refill: 0 - gabapentin (NEURONTIN) 300 MG capsule; Take 1 capsule (300 mg total) by mouth at bedtime.  Dispense: 30 capsule; Refill: 0  Call if symptoms change or if not rapidly improving.          Mila Merry, MD  [facility]

## 2016-06-26 ENCOUNTER — Ambulatory Visit (INDEPENDENT_AMBULATORY_CARE_PROVIDER_SITE_OTHER): Payer: Managed Care, Other (non HMO) | Admitting: Family Medicine

## 2016-06-26 ENCOUNTER — Encounter: Payer: Self-pay | Admitting: Family Medicine

## 2016-06-26 VITALS — BP 110/70 | HR 87 | Temp 98.1°F | Resp 16 | Wt 192.0 lb

## 2016-06-26 DIAGNOSIS — M5412 Radiculopathy, cervical region: Secondary | ICD-10-CM

## 2016-06-26 DIAGNOSIS — M5431 Sciatica, right side: Secondary | ICD-10-CM | POA: Diagnosis not present

## 2016-06-26 NOTE — Progress Notes (Signed)
       Patient: Bernadette HoitDavid M Brethauer Male    DOB: [birth year]   61 y.o.   MRN: [id number] Visit Date: 06/26/2016  Today's Provider: Mila Merryonald Fisher, MD   Chief Complaint  Patient presents with  . Follow-up  . Back Pain   Subjective:    HPI  Patient is here for recurrent low back pain with right sided sciatica. Was last seen in January and was prescribed rednisone taper. He had minimal improvement and now has persistent pain radiating down side of right leg. He also has persistent neck pain which radiates down both arms when he looks or reaches up. Has been getting regular chiropractic treatments for the last 6 months which only provides slight temporary relief. Has had no new injuries.  Imaging studies on 01/22/2016 as follows.    LS Spine FINDINGS: Soft tissue structures are unremarkable. Bilateral nephrolithiasis again noted. 3 mm calcific density noted projected over the right ureter the level of L2. Stable pelvic calcifications consistent phleboliths. No bowel distention. Stool noted throughout colon. Degenerative changes lumbar spine and both hips.  C-Spine IMPRESSION: Diffuse degenerative change.  No acute abnormality .  No Known Allergies   Current Outpatient Prescriptions:  .  gabapentin (NEURONTIN) 300 MG capsule, Take 1 capsule (300 mg total) by mouth at bedtime., Disp: 30 capsule, Rfl: 0  Review of Systems  Constitutional: Negative for appetite change, chills and fever.  Respiratory: Negative for chest tightness, shortness of breath and wheezing.   Cardiovascular: Negative for chest pain and palpitations.  Gastrointestinal: Negative for abdominal pain, nausea and vomiting.  Musculoskeletal: Positive for back pain and myalgias.  Neurological: Positive for numbness.    Social History  Substance Use Topics  . Smoking status: Never Smoker  . Smokeless tobacco: Never Used  . Alcohol use Yes   Objective:   BP 110/70 (BP Location: Right Arm, Patient Position: Sitting,  Cuff Size: Large)   Pulse 87   Temp 98.1 F (36.7 C) (Oral)   Resp 16   Wt 192 lb (87.1 kg)   SpO2 98%   BMI 28.35 kg/m  Vitals:   06/26/16 1638  BP: 110/70  Pulse: 87  Resp: 16  Temp: 98.1 F (36.7 C)  TempSrc: Oral  SpO2: 98%  Weight: 192 lb (87.1 kg)     Physical Exam  General appearance: alert, well developed, well nourished, cooperative and in no distress Head: Normocephalic, without obvious abnormality, atraumatic Respiratory: Respirations even and unlabored, normal respiratory rate MS: diffuse tenderness s-spine and l-spine. No gross deformities. +5 muscle strength ues and les. positive straight leg test on right.      Assessment & Plan:      1. Sciatica of right side   2. Cervical radiculopathy  Failed 5 months conservative treatments with chiropractic treatments, home back exercises and steroids.   MRI c-sine MRI - lspine       Mila Merryonald Fisher, MD  [facility]

## 2016-06-30 ENCOUNTER — Telehealth: Payer: Self-pay | Admitting: Family Medicine

## 2016-06-30 ENCOUNTER — Other Ambulatory Visit: Payer: Self-pay | Admitting: *Deleted

## 2016-06-30 DIAGNOSIS — M5431 Sciatica, right side: Secondary | ICD-10-CM

## 2016-06-30 NOTE — Progress Notes (Unsigned)
Order for MR lumbar spine re-enter with contrast.

## 2016-07-03 ENCOUNTER — Telehealth: Payer: Self-pay | Admitting: Family Medicine

## 2016-07-03 NOTE — Telephone Encounter (Signed)
Please call patient's insurance about denial for MRI. Denial letter states that has to have failed 6 weeks of Doctor prescribed treatment or observation, but I clearly stated in the office note that he has failed over 5 months of conservative treatment.

## 2016-07-06 ENCOUNTER — Telehealth: Payer: Self-pay | Admitting: Family Medicine

## 2016-07-06 NOTE — Telephone Encounter (Addendum)
error 

## 2016-07-06 NOTE — Telephone Encounter (Signed)
FYI-- Authorization for MRI of cervical and lumbar spine have been denied

## 2016-07-06 NOTE — Telephone Encounter (Signed)
I spoke with insurance company who states they will require a peer to peer to get MRI approved.They have pt's medical records and state that prednisone and gabapentin was started in Jan but there is no indication as to how long this was taken.Phone [phone].Case # [id number]

## 2016-07-10 ENCOUNTER — Telehealth: Payer: Self-pay | Admitting: Family Medicine

## 2016-07-10 DIAGNOSIS — M5090 Cervical disc disorder, unspecified, unspecified cervical region: Secondary | ICD-10-CM | POA: Insufficient documentation

## 2016-07-10 NOTE — Telephone Encounter (Signed)
Please see note from 07/06/16.Pt's insurance company wants peer to peer to get MRI approved

## 2016-07-10 NOTE — Telephone Encounter (Signed)
Please advise 

## 2016-07-10 NOTE — Telephone Encounter (Signed)
Authorization for MRI c-spine and LS spine without contrast #[id number]  Will be faxing authorization. (double check and make sure authorization is for both C-spine and LS spine when it is faxed Thanks!

## 2016-07-17 ENCOUNTER — Ambulatory Visit
Admission: RE | Admit: 2016-07-17 | Discharge: 2016-07-17 | Disposition: A | Discharge status: Home / Self Care | Payer: Managed Care, Other (non HMO) | Source: Ambulatory Visit | Attending: Family Medicine | Admitting: Family Medicine

## 2016-07-17 ENCOUNTER — Telehealth: Payer: Self-pay

## 2016-07-17 ENCOUNTER — Encounter: Payer: Self-pay | Admitting: Family Medicine

## 2016-07-17 DIAGNOSIS — M5090 Cervical disc disorder, unspecified, unspecified cervical region: Secondary | ICD-10-CM | POA: Insufficient documentation

## 2016-07-17 DIAGNOSIS — M5126 Other intervertebral disc displacement, lumbar region: Secondary | ICD-10-CM | POA: Insufficient documentation

## 2016-07-17 DIAGNOSIS — M48061 Spinal stenosis, lumbar region without neurogenic claudication: Secondary | ICD-10-CM | POA: Diagnosis not present

## 2016-07-17 DIAGNOSIS — M5431 Sciatica, right side: Secondary | ICD-10-CM | POA: Insufficient documentation

## 2016-07-17 DIAGNOSIS — M5020 Other cervical disc displacement, unspecified cervical region: Secondary | ICD-10-CM | POA: Insufficient documentation

## 2016-07-17 DIAGNOSIS — M50222 Other cervical disc displacement at C5-C6 level: Secondary | ICD-10-CM | POA: Insufficient documentation

## 2016-07-17 NOTE — Telephone Encounter (Signed)
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 He needs referral to neurosurgery ASAP.

## 2017-06-07 IMAGING — CR DG CERVICAL SPINE COMPLETE 4+V
1 series · 7 of 7 positions shown · non-contrast
Comparison: 06/21/2013 .

CLINICAL DATA: Injury.

EXAM:
CERVICAL SPINE - COMPLETE 4+ VIEW

[Series 1: dg cervical spine complete · 0.14mm/px · 7 of 7 slices shown]
[im 1/7]
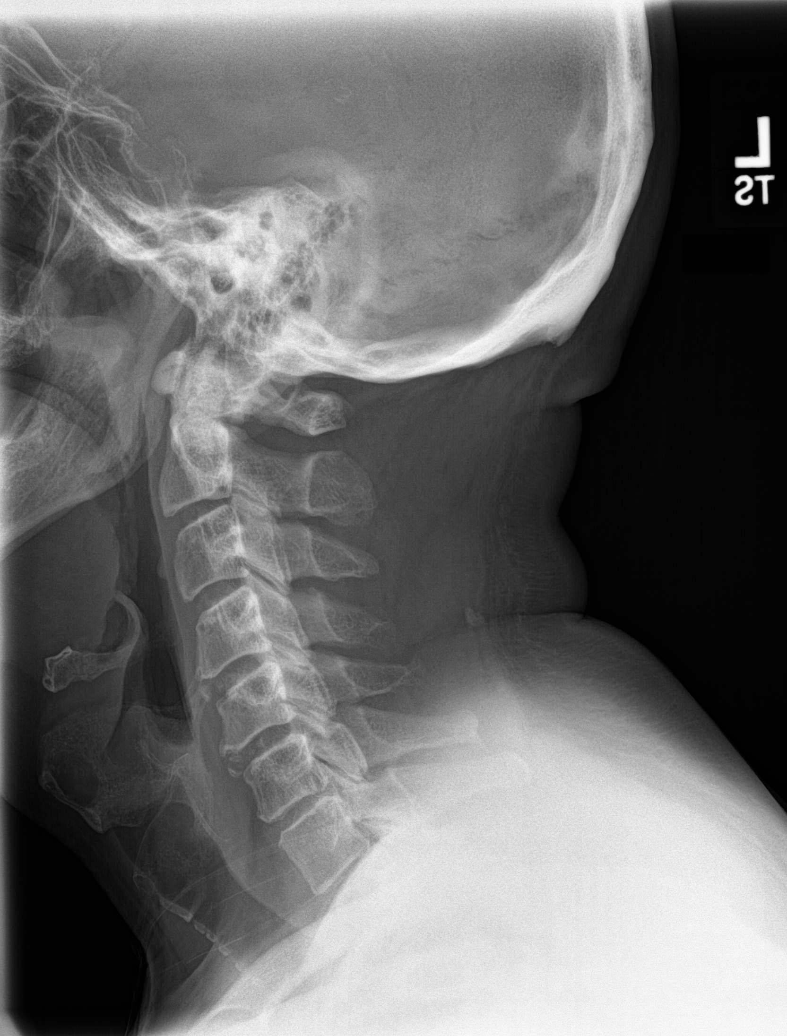
[im 2/7]
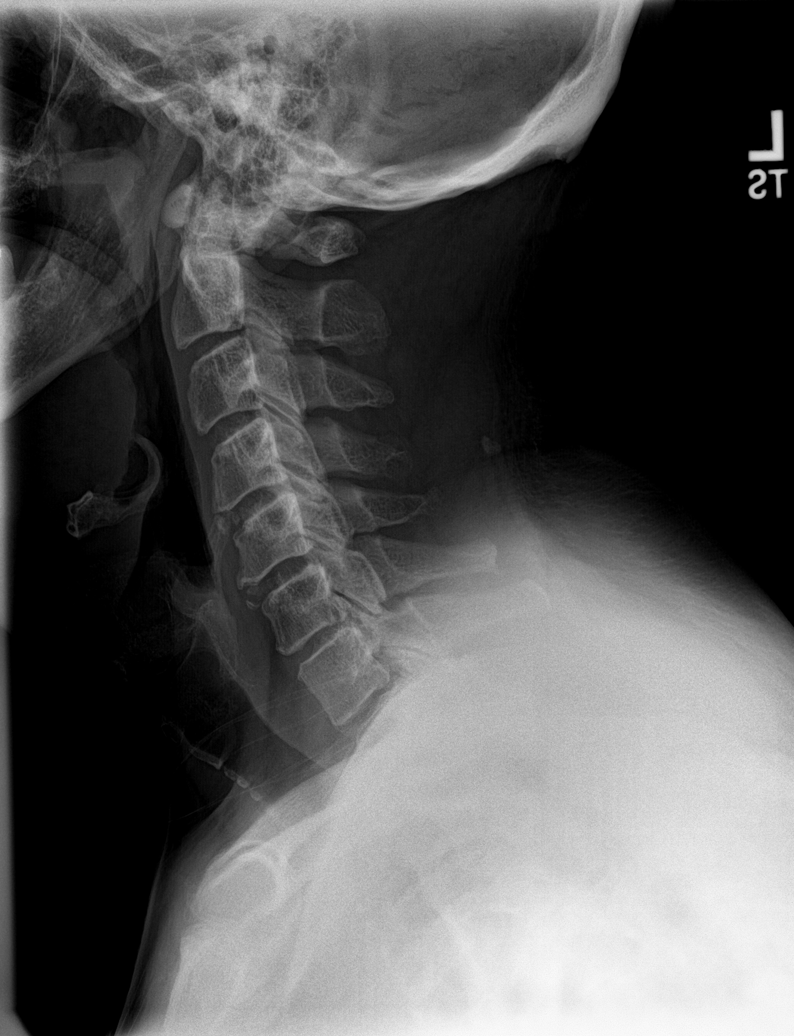
[im 3/7]
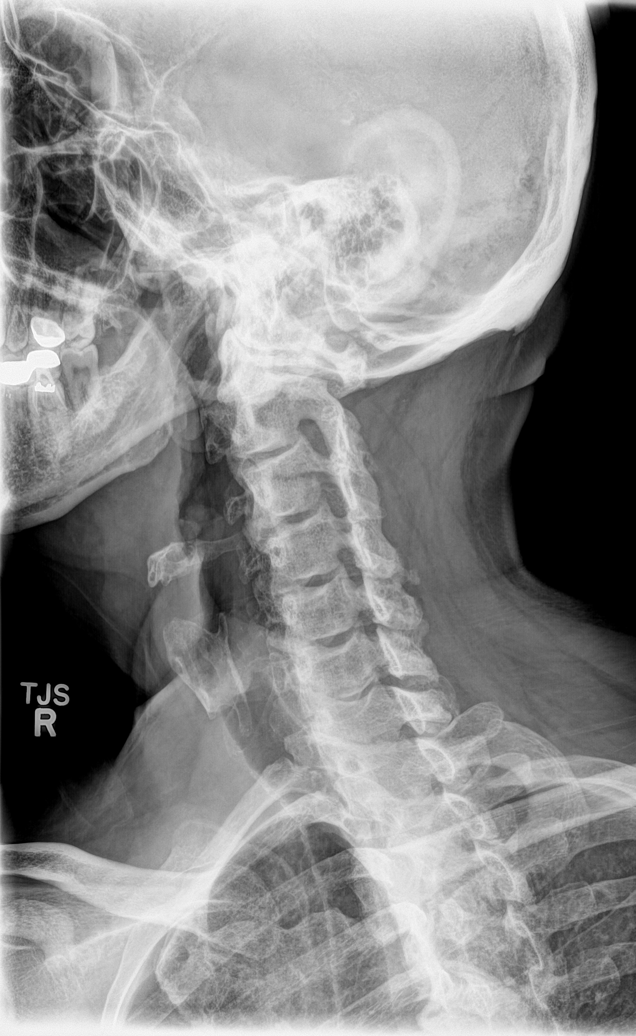
[im 4/7]
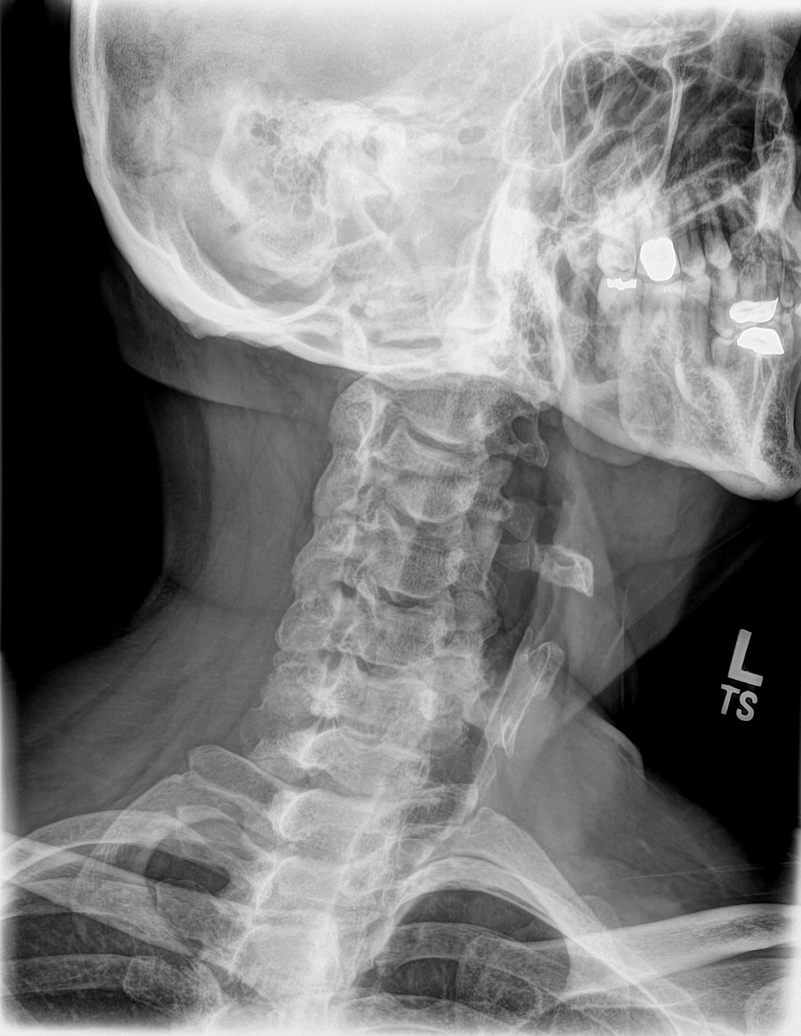
[im 5/7]
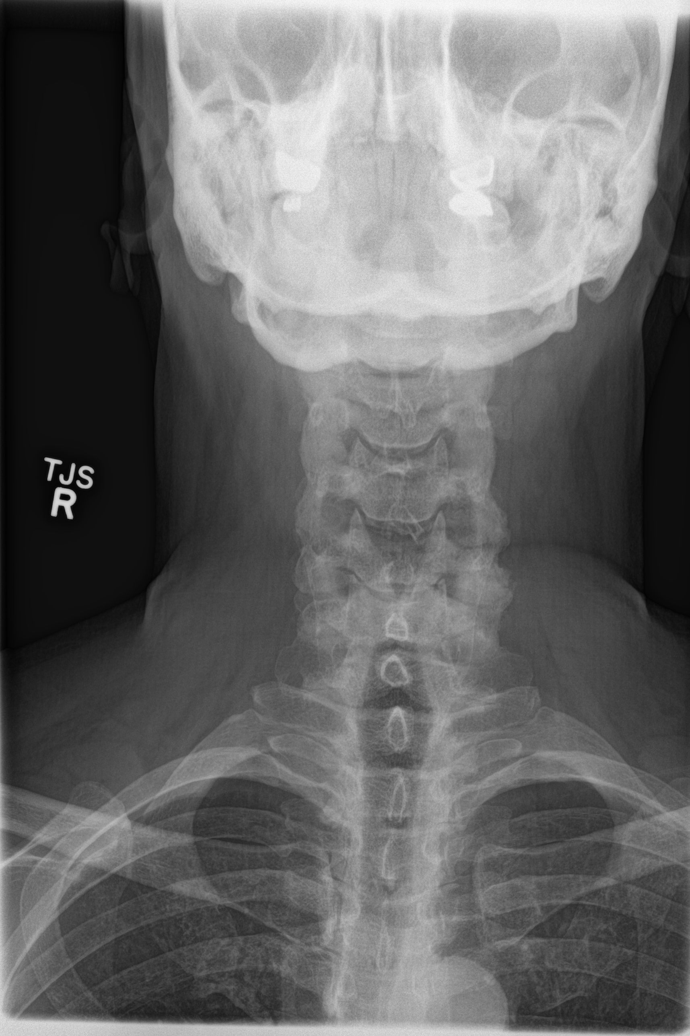
[im 6/7]
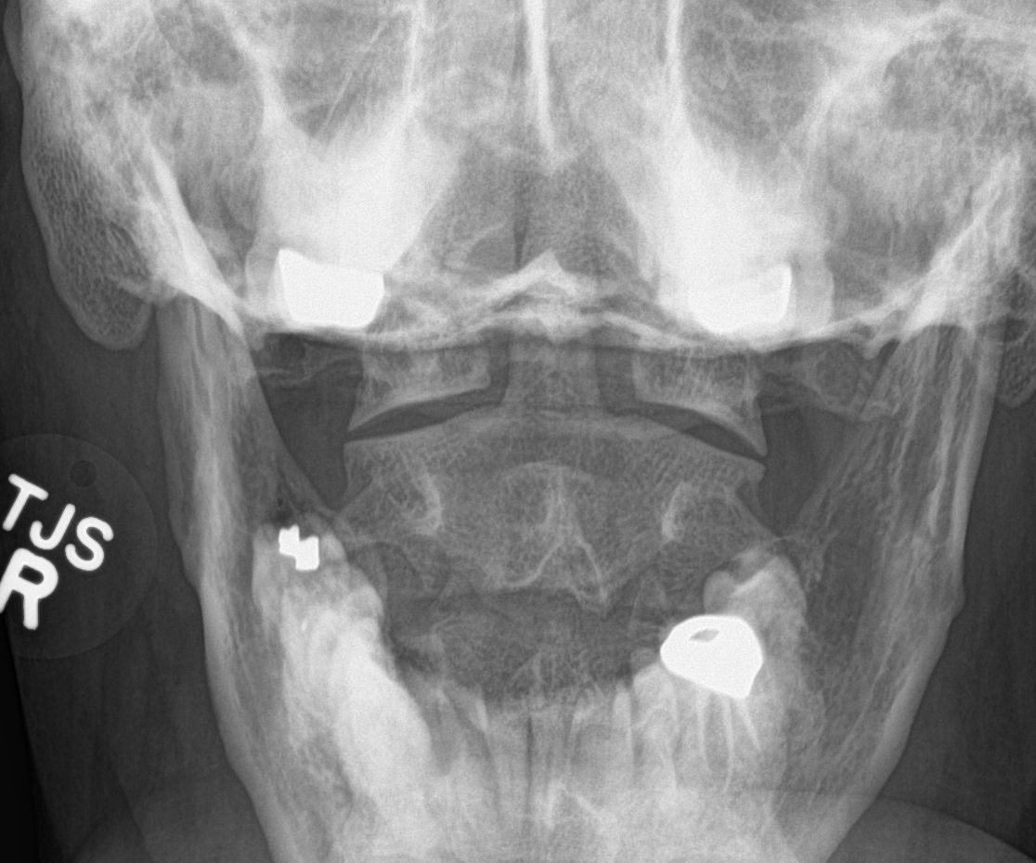
[im 7/7]
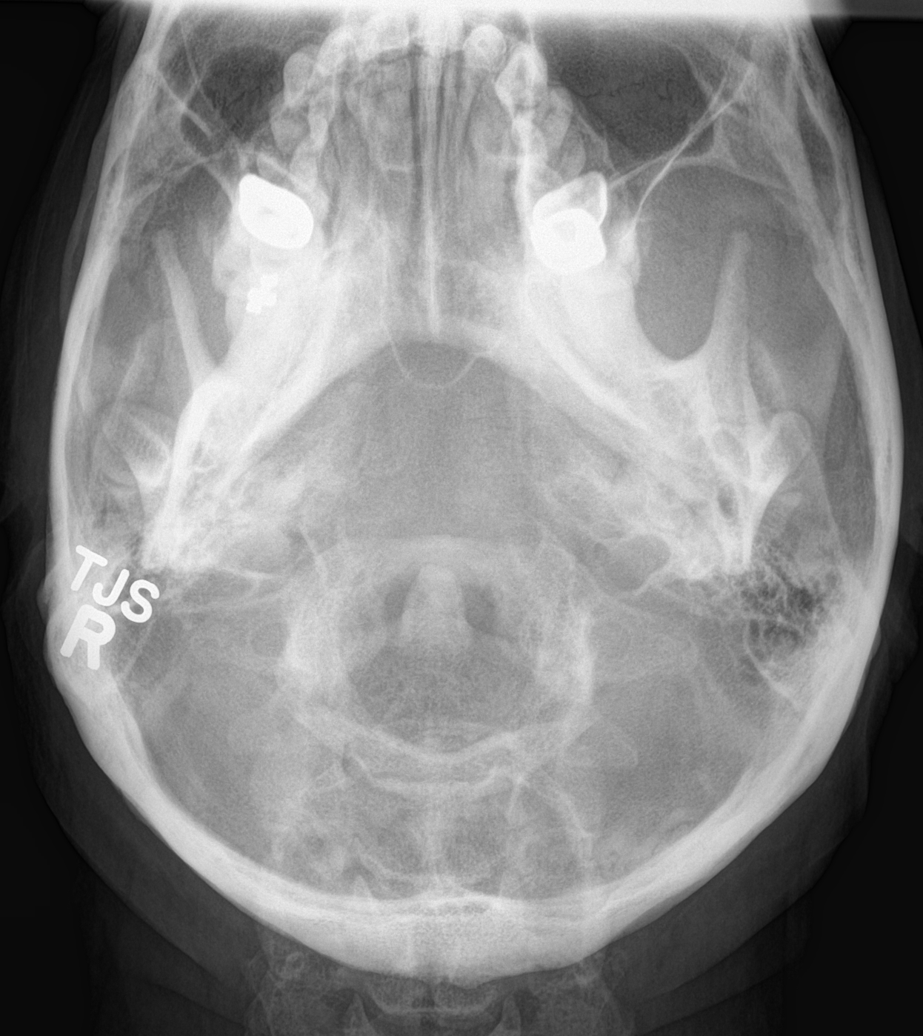

[7 of 7 positions shown; findings below may reference images not displayed]

FINDINGS: Diffuse multilevel degenerative change. No acute bony abnormality
identified. Pulmonary apices are clear.
IMPRESSION: Diffuse degenerative change.  No acute abnormality .

## 2017-06-07 IMAGING — CR DG LUMBAR SPINE COMPLETE 4+V
1 series · 5 of 5 positions shown · non-contrast
Comparison: 10/25/2013 .  CT 10/07/2012

CLINICAL DATA: Pain .

EXAM:
LUMBAR SPINE - COMPLETE 4+ VIEW

[Series 1: dg lumbar spine complete 4 +v · 0.14mm/px · 5 of 5 slices shown]
[im 1/5]
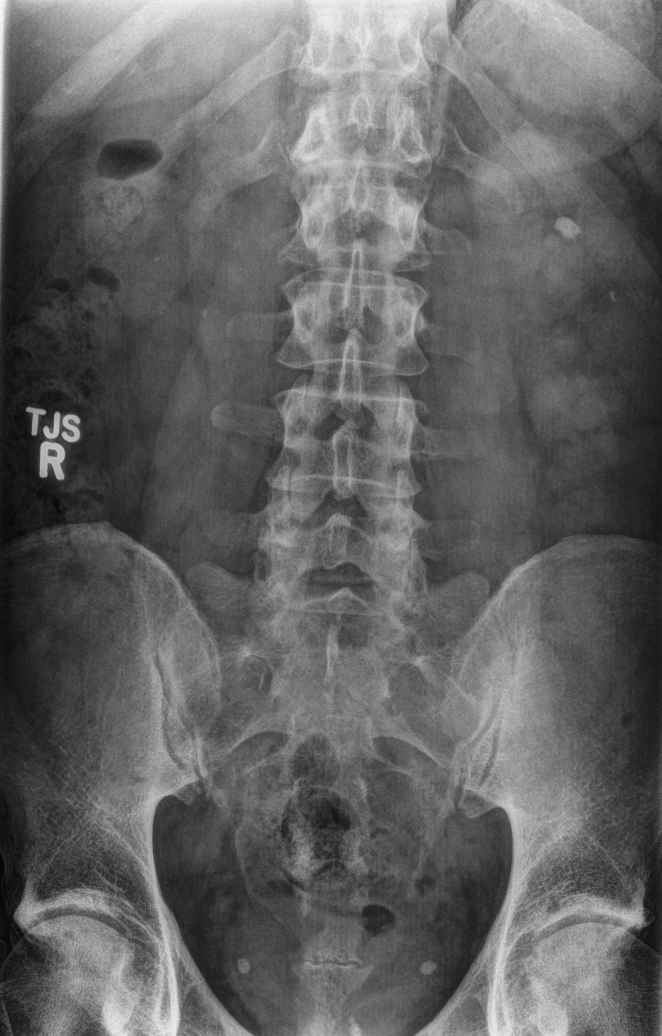
[im 2/5]
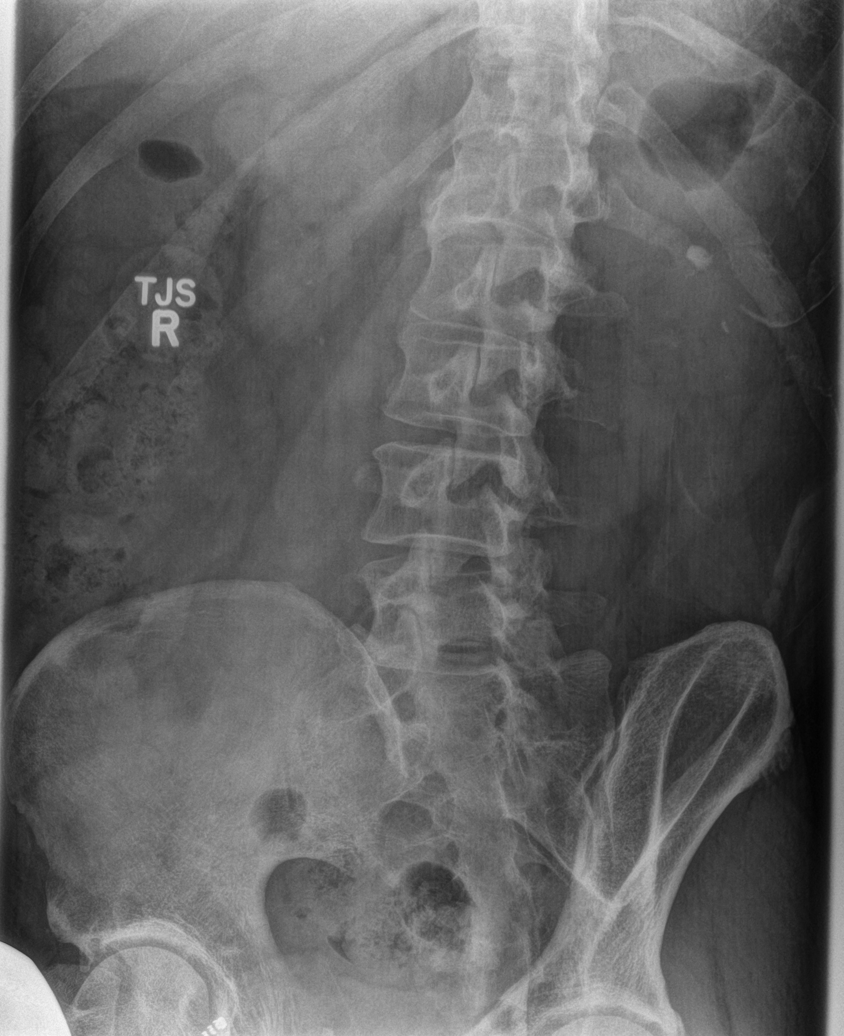
[im 3/5]
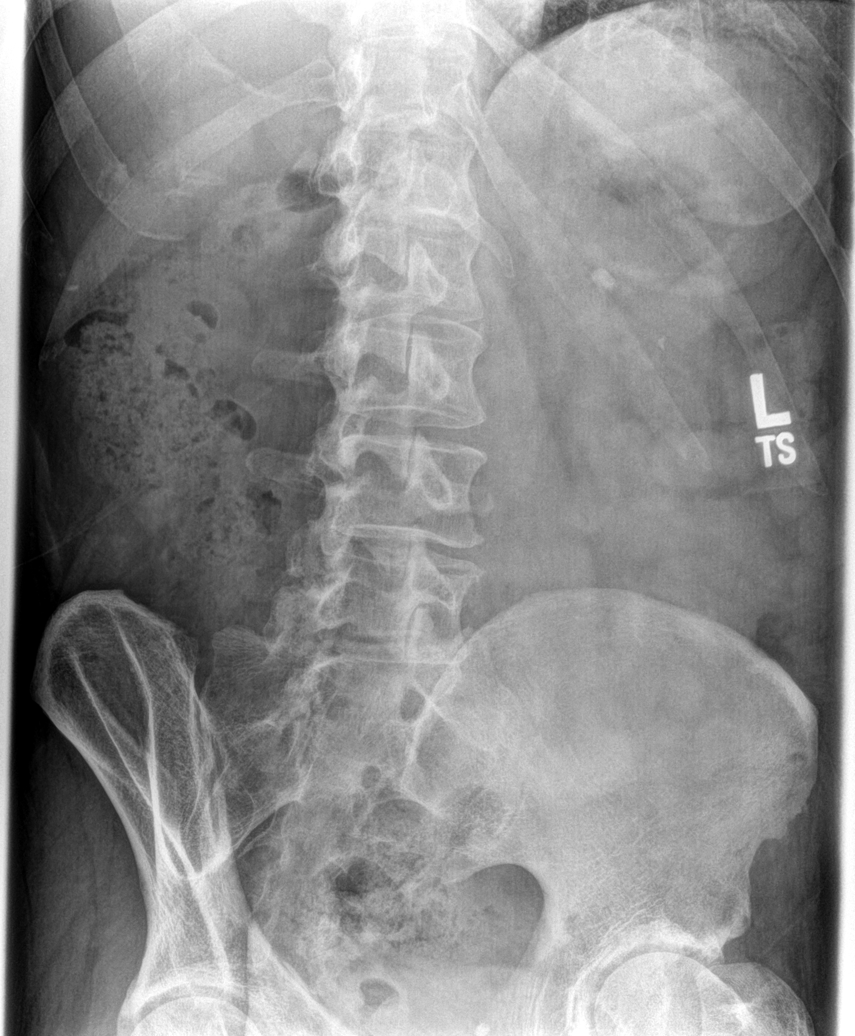
[im 4/5]
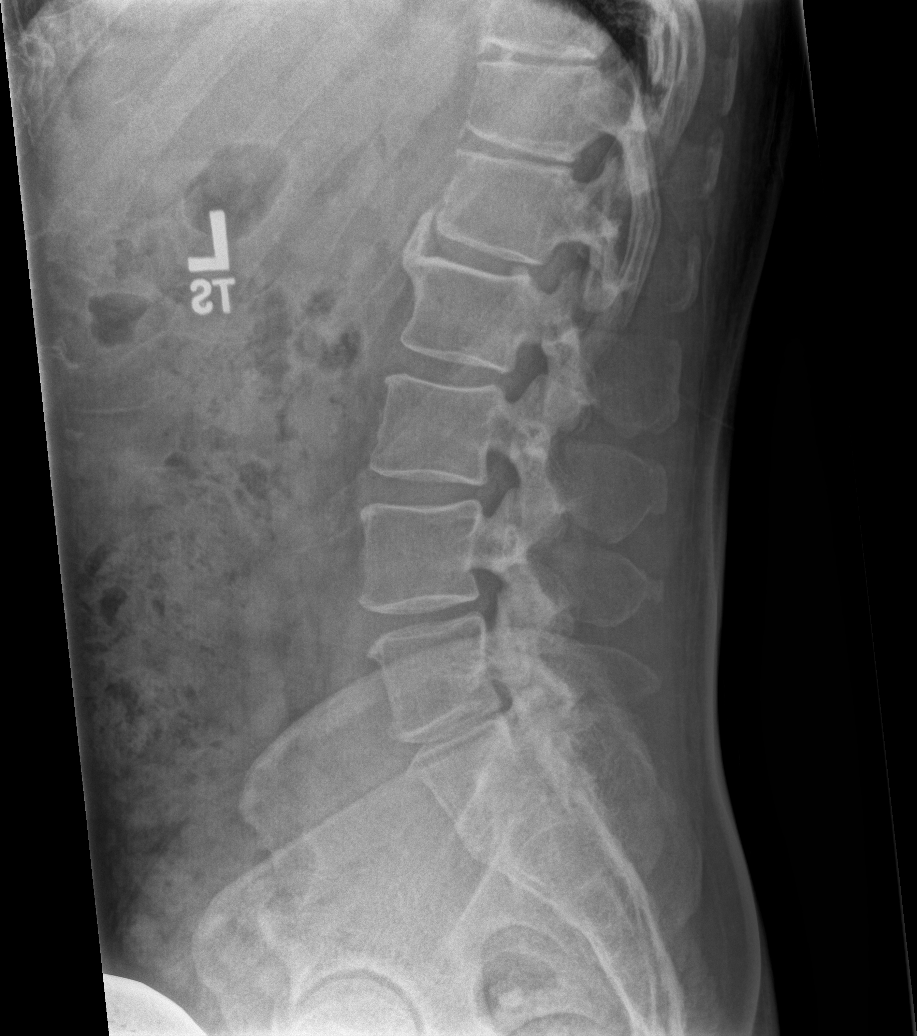
[im 5/5]
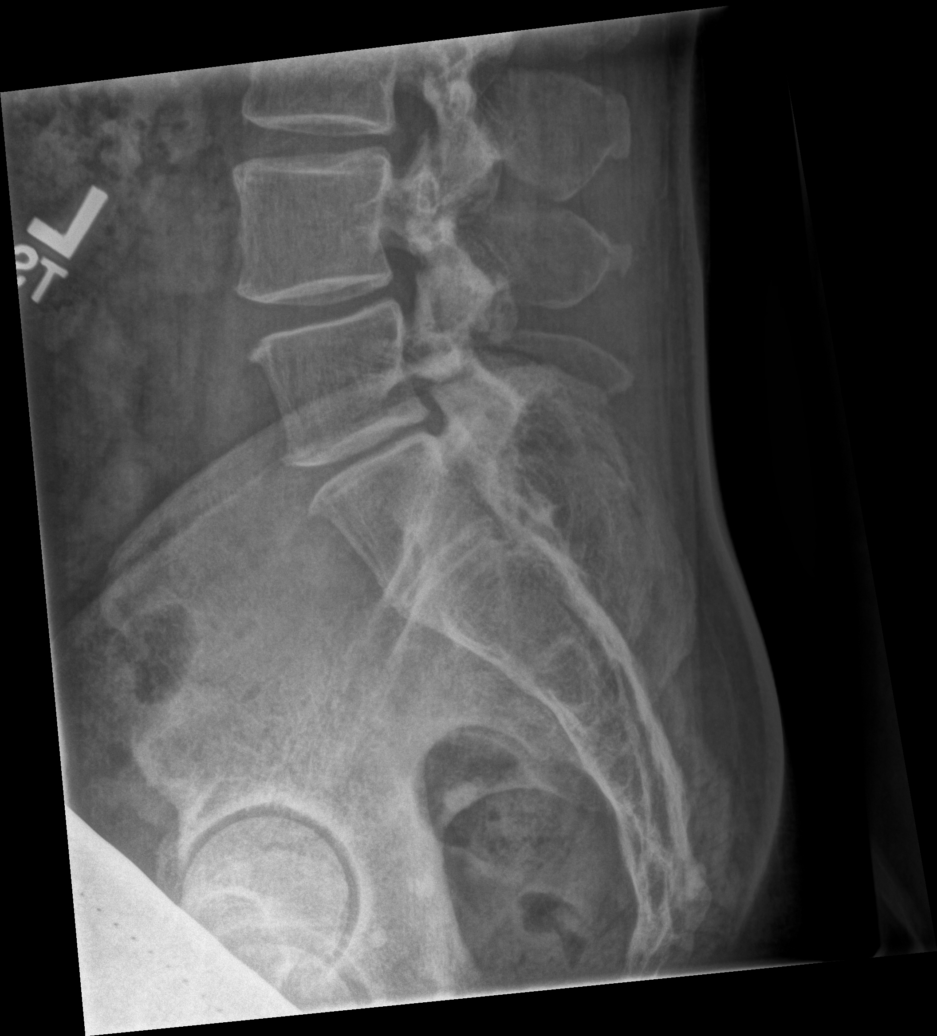

[5 of 5 positions shown; findings below may reference images not displayed]

FINDINGS: Soft tissue structures are unremarkable. Bilateral nephrolithiasis
again noted. 3 mm calcific density noted projected over the right
ureter the level of L2. Stable pelvic calcifications consistent
phleboliths. No bowel distention. Stool noted throughout colon.
Degenerative changes lumbar spine and both hips.
IMPRESSION: 1. 3 mm calcific density projected over the proximal right ureter.

2. Bilateral nephrolithiasis.

## 2020-03-22 ENCOUNTER — Ambulatory Visit: Payer: Managed Care, Other (non HMO) | Admitting: Family Medicine

## 2020-03-22 ENCOUNTER — Other Ambulatory Visit: Payer: Self-pay

## 2020-03-22 ENCOUNTER — Encounter: Payer: Self-pay | Admitting: Family Medicine

## 2020-03-22 VITALS — BP 149/94 | HR 89 | Temp 99.0°F | Resp 16 | Ht 69.0 in | Wt 188.0 lb

## 2020-03-22 DIAGNOSIS — M79652 Pain in left thigh: Secondary | ICD-10-CM

## 2020-03-22 MED ORDER — HYDROCODONE-ACETAMINOPHEN 7.5-300 MG PO TABS
1.0000 | ORAL_TABLET | Freq: Three times a day (TID) | ORAL | 0 refills | Status: DC | PRN
Start: 2020-03-22 — End: 2021-09-02

## 2020-03-22 MED ORDER — MELOXICAM 15 MG PO TABS: 15.0000 mg | ORAL_TABLET | Freq: Every day | ORAL | 0 refills | Status: DC

## 2020-03-22 NOTE — Progress Notes (Signed)
Established patient visit   Patient: Tyrone Wang   DOB: 08-01-1955   65 y.o. Male  MRN: [id number] Visit Date: 03/22/2020  Today's healthcare provider: Mila Merry, MD   Chief Complaint  Patient presents with  . Leg Pain   Subjective    Leg Pain  Incident onset: 3 weeks ago. The injury mechanism was a twisting injury. The pain is present in the left leg. The quality of the pain is described as aching. The pain has been constant since onset. Associated symptoms include an inability to bear weight, a loss of sensation, muscle weakness, numbness and tingling. The symptoms are aggravated by weight bearing. He has tried rest, heat and ice (also massage) for the symptoms. The treatment provided no relief.   He states it started after he lost his footing, catching and twisting his left leg on a stump in the woods, resulting in his entire leg twisting. The pain is now mostly between his inner knee going up inner thigh, through inguinal area around to left hip. Is is unable to sleep at night due to pain. Hurts to walk. Was swollen around inner thigh but that has gone down. Tried OTC ibuprofen and tylenol with very little improvement. Has not improved at all over the last week or two.      Medications: Outpatient Medications Prior to Visit  Medication Sig  . [DISCONTINUED] gabapentin (NEURONTIN) 300 MG capsule Take 1 capsule (300 mg total) by mouth at bedtime.   No facility-administered medications prior to visit.    Review of Systems  Constitutional: Negative for appetite change, chills and fever.  Respiratory: Negative for chest tightness, shortness of breath and wheezing.   Cardiovascular: Negative for chest pain and palpitations.  Gastrointestinal: Negative for abdominal pain, nausea and vomiting.  Musculoskeletal: Positive for myalgias.  Neurological: Positive for tingling and numbness.       Objective    BP (!) 149/94 (BP Location: Left Arm, Patient Position:  Sitting, Cuff Size: Normal)   Pulse 89   Temp 99 F (37.2 C) (Temporal)   Resp 16   Ht 5\' 9"  (1.753 m)   Wt 188 lb (85.3 kg)   BMI 27.76 kg/m     Physical Exam   Tender along medial aspect of knee and thigh. Unable to perform straight leg raise on left. Slight swelling along anterior and medial thigh. Is able to stand and walk without assistance, but favors left leg.     Assessment & Plan     1. Pain of left thigh Nearly 3 weeks out from injury with no improvement, significant limitation to leg movement and difficulty finding comfortable position and sleeping at night.   - meloxicam (MOBIC) 15 MG tablet; Take 1 tablet (15 mg total) by mouth daily.  Dispense: 30 tablet; Refill: 0 - HYDROcodone-Acetaminophen 7.5-300 MG TABS; Take 1 tablet by mouth 3 (three) times daily as needed (recommended he only take hs to help sleep if possible).  Dispense: 15 tablet; Refill: 0 - Ambulatory referral to Orthopedic Surgery   Counseled he is due for routine health maintenance visit, which he plans to schedule later in the spring.       The entirety of the information documented in the History of Present Illness, Review of Systems and Physical Exam were personally obtained by me. Portions of this information were initially documented by the CMA and reviewed by me for thoroughness and accuracy.      08-10-1990, MD  [facility] [phone] (phone) [phone] (fax)  [place]

## 2020-04-14 ENCOUNTER — Other Ambulatory Visit: Payer: Self-pay | Admitting: Family Medicine

## 2020-04-14 DIAGNOSIS — M79652 Pain in left thigh: Secondary | ICD-10-CM

## 2020-04-14 NOTE — Telephone Encounter (Signed)
Requested medication (s) are due for refill today Last ordered 03/22/20 #30 tabs no refills  Requested medication (s) are on the active medication list Yes  Future visit scheduled No  Note to clinic-does not meet required labs for protocol.   Requested Prescriptions  Pending Prescriptions Disp Refills   meloxicam (MOBIC) 15 MG tablet [Pharmacy Med Name: MELOXICAM 15 MG TABLET] 30 tablet 0    Sig: TAKE 1 TABLET (15 MG TOTAL) BY MOUTH DAILY.      Analgesics:  COX2 Inhibitors Failed - 04/14/2020  9:29 AM      Failed - HGB in normal range and within 360 days    Hemoglobin  Date Value Ref Range Status  07/26/2015 15.6 13.0 - 18.0 g/dL Final   HGB  Date Value Ref Range Status  02/13/2012 15.6 13.0 - 18.0 g/dL Final          Failed - Cr in normal range and within 360 days    Creatinine  Date Value Ref Range Status  02/13/2012 1.01 0.60 - 1.30 mg/dL Final   Creatinine, Ser  Date Value Ref Range Status  07/26/2015 1.02 0.61 - 1.24 mg/dL Final          Passed - Patient is not pregnant      Passed - Valid encounter within last 12 months    Recent Outpatient Visits           3 weeks ago Pain of left thigh   [facility] Malva Limes, MD   3 years ago Sciatica of right side   [facility] Malva Limes, MD   4 years ago Acute midline low back pain with right-sided sciatica   [facility] Malva Limes, MD   4 years ago Fever, unspecified fever cause   [facility] Malva Limes, MD

## 2020-11-19 DIAGNOSIS — U071 COVID-19: Secondary | ICD-10-CM

## 2020-11-19 HISTORY — DX: COVID-19: U07.1

## 2021-02-24 ENCOUNTER — Encounter: Payer: Managed Care, Other (non HMO) | Admitting: Family Medicine

## 2021-06-10 ENCOUNTER — Encounter: Payer: Managed Care, Other (non HMO) | Admitting: Family Medicine

## 2021-09-01 NOTE — Progress Notes (Signed)
I,Jana Robinson,acting as a scribe for Mila Merry, MD.,have documented all relevant documentation on the behalf of Mila Merry, MD,as directed by  Mila Merry, MD while in the presence of Mila Merry, MD.    Complete physical exam   Patient: Tyrone Wang   DOB: [birth year]   66 y.o. Male  MRN: [id number] Visit Date: 09/02/2021  Today's healthcare provider: Mila Merry, MD   Chief Complaint  Patient presents with   Annual Exam   Subjective    Tyrone Wang is a 66 y.o. male who presents today for a complete physical exam.  He reports consuming a general diet. Home exercise routine includes walking and stretching . He generally feels well. He reports sleeping well. He does have additional problems to discuss today.  Check on cyst on back of neck. Has been there for many years, but is not sore or painful.   He presents with extensive list of issues he has questions about. He reports he was travelling in the [place] at Thanksgiving time in 2022 and contracted serious case of Covid-19 that put him in the hospital for 10 days. He states that ever since then he has had daily cough, worse in the morning, sometimes productive of white sputum. He is concerned about long term lung damage from Covid.   He also reports feeling increasing tired over the last few years. He does have known history of OSA prescribed CPAP many years ago, but wasn't able to sleep well with machine so only used it for a short period of time. Denies any chest pains or palpitations. Feels a little sleepy during the day.   He also has known hypertension and has been attempting to control blood pressure with diet. However he reports most home blood pressures are well over 140s/90s.   Past Medical History:  Diagnosis Date   COVID-19 11/2020   [place]   Diverticulosis    GERD (gastroesophageal reflux disease)    History of chicken pox    History of  measles    History of mumps    Kidney stones    Past Surgical History:  Procedure Laterality Date   LITHOTRIPSY  2011   TONSILLECTOMY  1976   UPPER GASTROINTESTINAL ENDOSCOPY  11/25/2012   [facility]; Dr. Shelle Iron; Normal Esophagus. -A single small papule Hyperpasia nodule.- Non bleeding erosive gastropathy= Chronic gastritis on Biopsy. -Erythematous Duodenopathy. No H. Pylori. Repeat 11/2013 per Dr. Shelle Iron   WISDOM TOOTH EXTRACTION     Social History   Socioeconomic History   Marital status: Divorced    Spouse name: Not on file   Number of children: 0   Years of education: Not on file   Highest education level: Not on file  Occupational History   Occupation: Location manager  Tobacco Use   Smoking status: Never   Smokeless tobacco: Never  Substance and Sexual Activity   Alcohol use: Yes   Drug use: No   Sexual activity: Not on file  Other Topics Concern   Not on file  Social History Narrative   Not on file   Social Determinants of Health   Financial Resource Strain: Not on file  Food Insecurity: Not on file  Transportation Needs: Not on file  Physical Activity: Not on file  Stress: Not on file  Social Connections: Not on file  Intimate Partner Violence: Not on file   Family Status  Relation Name Status   Mother  Alive  Father  Alive   Sister  Alive   Brother  Alive   Sister  Alive   Family History  Problem Relation Age of Onset   Diabetes Father        type 2   No Known Allergies  Patient Care Team: Malva Limes, MD as PCP - General (Family Medicine)   Medications: None   Review of Systems  Constitutional:  Positive for fatigue.  HENT:  Positive for drooling and mouth sores.   Respiratory:  Positive for apnea and cough.   Musculoskeletal:  Positive for back pain.  All other systems reviewed and are negative.     Objective      Vitals:   09/02/21 0959 09/02/21 1003  BP: (!) 168/111 (!) 138/108  Pulse: 88   Resp: 16   Temp: 98.8 F (37.1 C)    SpO2: 95%      Physical Exam   General Appearance:    Well developed, well nourished male. Alert, cooperative, in no acute distress, appears stated age  Head:    Normocephalic, without obvious abnormality, atraumatic  Eyes:    PERRL, conjunctiva/corneas clear, EOM's intact, fundi    benign, both eyes       Ears:    Normal TM's and external ear canals, both ears  Nose:   Nares normal, septum midline, mucosa normal, no drainage   or sinus tenderness  Throat:   Lips, mucosa, and tongue normal; teeth and gums normal  Neck:   Supple, symmetrical, trachea midline, no adenopathy;       thyroid:  No enlargement/tenderness/nodules; no carotid   bruit or JVD. About 4cmx3cm non tender non-inflamed sebaceous cyst just below base of neck slightly left of midline.   Back:     Symmetric, no curvature, ROM normal, no CVA tenderness  Lungs:     Clear to auscultation bilaterally, respirations unlabored  Chest wall:    No tenderness or deformity  Heart:    Normal heart rate. Normal rhythm. No murmurs, rubs, or gallops.  S1 and S2 normal  Abdomen:     Soft, non-tender, bowel sounds active all four quadrants,    no masses, no organomegaly  Genitalia:    deferred  Rectal:    deferred  Extremities:   All extremities are intact. No cyanosis or edema  Pulses:   2+ and symmetric all extremities  Skin:   Skin color, texture, turgor normal, no rashes or lesions  Lymph nodes:   Cervical, supraclavicular, and axillary nodes normal  Neurologic:   CNII-XII intact. Normal strength, sensation and reflexes      throughout     Last depression screening scores    09/02/2021   10:03 AM 03/22/2020    1:23 PM  PHQ 2/9 Scores  PHQ - 2 Score 0 0  PHQ- 9 Score 3    Last fall risk screening    09/02/2021   10:03 AM  Fall Risk   Falls in the past year? 0  Number falls in past yr: 0  Injury with Fall? 0  Risk for fall due to : No Fall Risks   Last Audit-C alcohol use screening    09/02/2021   10:03 AM   Alcohol Use Disorder Test (AUDIT)  1. How often do you have a drink containing alcohol? 0  3. How often do you have six or more drinks on one occasion? 0   A score of 3 or more in women, and 4 or more in men  indicates increased risk for alcohol abuse, EXCEPT if all of the points are from question 1   No results found for any visits on 09/02/21.  Assessment & Plan    Routine Health Maintenance and Physical Exam  Exercise Activities and Dietary recommendations  Goals   None     Immunization History  Administered Date(s) Administered   Influenza,inj,Quad PF,6+ Mos 10/29/2019   Zoster, Live 01/22/2012    Health Maintenance  Topic Date Due   COVID-19 Vaccine (1) Never done   HIV Screening  Never done   Hepatitis C Screening  Never done   TETANUS/TDAP  Never done   Zoster Vaccines- Shingrix (1 of 2) Never done   COLONOSCOPY (Pts 45-62yrs Insurance coverage will need to be confirmed)  05/07/2019   INFLUENZA VACCINE  08/19/2021   HPV VACCINES  Aged Out    Discussed health benefits of physical activity, and encouraged him to engage in regular exercise appropriate for his age and condition.   - PSA Total (Reflex To Free) ([facility] only) - CBC - Comprehensive metabolic panel - Lipid panel - Hemoglobin A1c - EKG 12-Lead  2. Need for hepatitis C screening test  - Hepatitis C antibody  3. Prostate cancer screening  - PSA Total (Reflex To Free) ([facility] only)  4. Colon cancer screening  - Ambulatory referral to gastroenterology for colonoscopy  5. Need for shingles vaccine  - Administer Zoster, Recombinant (Shingrix) Vaccine #1  6. Need for tetanus, diphtheria, and acellular pertussis (Tdap) vaccine in patient of adolescent age or older  - Administer Tetanus-diphtheria-acellular pertussis (Tdap) vaccine  7. Primary hypertension  - EKG 12-Lead Counseled that will start hctz or valsartan after reviewing lab results.   8. OSA (obstructive sleep apnea) Briefly  tried CPAP over a decade ago but did not tolerate. Consider he is still having issues with fatigue will consider getting up to date sleep study done.   9. Pure hypercholesterolemia Checking lipids today. He is interested in coronary artery scan which we'll decide on based on ASCVD risk   10. Pre-diabetes  - Hemoglobin A1c  11. Other fatigue  - TSH - Testosterone,Free and Total  Consider sleep study as above.   12. Chronic cough Started after 10 day hospitalization for Covid 19 in November 2022. Recommended he get upcoming covid vaccine.  - DG Chest 2 View; Future     The entirety of the information documented in the History of Present Illness, Review of Systems and Physical Exam were personally obtained by me. Portions of this information were initially documented by the CMA and reviewed by me for thoroughness and accuracy.     Mila Merry, MD  [facility] [phone] (phone) [phone] (fax)  [facility]

## 2021-09-02 ENCOUNTER — Encounter: Payer: Self-pay | Admitting: Family Medicine

## 2021-09-02 ENCOUNTER — Ambulatory Visit
Admission: RE | Admit: 2021-09-02 | Discharge: 2021-09-02 | Disposition: A | Discharge status: Home / Self Care | Payer: Managed Care, Other (non HMO) | Source: Ambulatory Visit | Attending: Family Medicine | Admitting: Family Medicine

## 2021-09-02 ENCOUNTER — Ambulatory Visit
Admission: RE | Admit: 2021-09-02 | Discharge: 2021-09-02 | Disposition: A | Discharge status: Home / Self Care | Payer: Managed Care, Other (non HMO) | Attending: Family Medicine | Admitting: Family Medicine

## 2021-09-02 ENCOUNTER — Ambulatory Visit: Payer: Managed Care, Other (non HMO) | Admitting: Family Medicine

## 2021-09-02 VITALS — BP 138/108 | HR 88 | Temp 98.8°F | Resp 16 | Wt 199.8 lb

## 2021-09-02 DIAGNOSIS — I10 Essential (primary) hypertension: Secondary | ICD-10-CM

## 2021-09-02 DIAGNOSIS — G4733 Obstructive sleep apnea (adult) (pediatric): Secondary | ICD-10-CM | POA: Diagnosis not present

## 2021-09-02 DIAGNOSIS — R5383 Other fatigue: Secondary | ICD-10-CM

## 2021-09-02 DIAGNOSIS — R7303 Prediabetes: Secondary | ICD-10-CM

## 2021-09-02 DIAGNOSIS — E7800 Pure hypercholesterolemia, unspecified: Secondary | ICD-10-CM | POA: Diagnosis not present

## 2021-09-02 DIAGNOSIS — Z0000 Encounter for general adult medical examination without abnormal findings: Secondary | ICD-10-CM | POA: Diagnosis not present

## 2021-09-02 DIAGNOSIS — R053 Chronic cough: Secondary | ICD-10-CM | POA: Diagnosis present

## 2021-09-02 DIAGNOSIS — Z23 Encounter for immunization: Secondary | ICD-10-CM | POA: Diagnosis not present

## 2021-09-02 DIAGNOSIS — Z125 Encounter for screening for malignant neoplasm of prostate: Secondary | ICD-10-CM | POA: Diagnosis not present

## 2021-09-02 DIAGNOSIS — Z1211 Encounter for screening for malignant neoplasm of colon: Secondary | ICD-10-CM | POA: Diagnosis not present

## 2021-09-02 DIAGNOSIS — Z1159 Encounter for screening for other viral diseases: Secondary | ICD-10-CM | POA: Diagnosis not present

## 2021-09-02 NOTE — Patient Instructions (Addendum)
Please review the attached list of medications and notify my office if there are any errors.   I recommend getting the updated Covid vaccine at your local pharmacy when they come out next month  We'll need to start you on a blood pressure medication after reviewing your labs, either valsartan or hydrochlorothiazide  Last recorded height was 5\' 9" . Weight goal for a BMI of 27 is 182.9 pounds.  Go to the [facility] on [facility] for chest Xray

## 2021-09-08 LAB — TESTOSTERONE,FREE AND TOTAL
Testosterone, Free: 2.9 pg/mL — ABNORMAL LOW (ref 6.6–18.1)
Testosterone: 296 ng/dL (ref 264–916)

## 2021-09-08 LAB — COMPREHENSIVE METABOLIC PANEL
ALT: 42 IU/L (ref 0–44)
AST: 35 IU/L (ref 0–40)
Albumin/Globulin Ratio: 2 (ref 1.2–2.2)
Albumin: 4.8 g/dL (ref 3.9–4.9)
Alkaline Phosphatase: 67 IU/L (ref 44–121)
BUN/Creatinine Ratio: 13 (ref 10–24)
BUN: 13 mg/dL (ref 8–27)
Bilirubin Total: 1.3 mg/dL — ABNORMAL HIGH (ref 0.0–1.2)
CO2: 22 mmol/L (ref 20–29)
Calcium: 10 mg/dL (ref 8.6–10.2)
Chloride: 100 mmol/L (ref 96–106)
Creatinine, Ser: 1.04 mg/dL (ref 0.76–1.27)
Globulin, Total: 2.4 g/dL (ref 1.5–4.5)
Glucose: 151 mg/dL — ABNORMAL HIGH (ref 70–99)
Potassium: 4.6 mmol/L (ref 3.5–5.2)
Sodium: 142 mmol/L (ref 134–144)
Total Protein: 7.2 g/dL (ref 6.0–8.5)
eGFR: 81 mL/min/{1.73_m2} (ref >59)

## 2021-09-08 LAB — LIPID PANEL
Chol/HDL Ratio: 4.3 ratio (ref 0.0–5.0)
Cholesterol, Total: 249 mg/dL — ABNORMAL HIGH (ref 100–199)
HDL: 58 mg/dL (ref >39)
LDL Chol Calc (NIH): 159 mg/dL — ABNORMAL HIGH (ref 0–99)
Triglycerides: 179 mg/dL — ABNORMAL HIGH (ref 0–149)
VLDL Cholesterol Cal: 32 mg/dL (ref 5–40)

## 2021-09-08 LAB — CBC
Hematocrit: 49.8 % (ref 37.5–51.0)
Hemoglobin: 15.8 g/dL (ref 13.0–17.7)
MCH: 25.6 pg — ABNORMAL LOW (ref 26.6–33.0)
MCHC: 31.7 g/dL (ref 31.5–35.7)
MCV: 81 fL (ref 79–97)
Platelets: 203 10*3/uL (ref 150–450)
RBC: 6.18 x10E6/uL — ABNORMAL HIGH (ref 4.14–5.80)
RDW: 13.4 % (ref 11.6–15.4)
WBC: 8.6 10*3/uL (ref 3.4–10.8)

## 2021-09-08 LAB — HEPATITIS C ANTIBODY: Hep C Virus Ab: NONREACTIVE

## 2021-09-08 LAB — PSA TOTAL (REFLEX TO FREE): Prostate Specific Ag, Serum: 1 ng/mL (ref 0.0–4.0)

## 2021-09-08 LAB — HEMOGLOBIN A1C
Est. average glucose Bld gHb Est-mCnc: 203 mg/dL
Hgb A1c MFr Bld: 8.7 % — ABNORMAL HIGH (ref 4.8–5.6)

## 2021-09-08 LAB — TSH: TSH: 1.78 u[IU]/mL (ref 0.450–4.500)

## 2021-09-10 ENCOUNTER — Other Ambulatory Visit: Payer: Self-pay

## 2021-09-10 DIAGNOSIS — R7989 Other specified abnormal findings of blood chemistry: Secondary | ICD-10-CM

## 2021-09-10 DIAGNOSIS — E119 Type 2 diabetes mellitus without complications: Secondary | ICD-10-CM

## 2021-09-10 DIAGNOSIS — E7800 Pure hypercholesterolemia, unspecified: Secondary | ICD-10-CM

## 2021-09-10 MED ORDER — ROSUVASTATIN CALCIUM 5 MG PO TABS: 5.0000 mg | ORAL_TABLET | Freq: Every day | ORAL | 2 refills | Status: DC

## 2021-09-10 MED ORDER — METFORMIN HCL ER 500 MG PO TB24: 500.0000 mg | ORAL_TABLET | Freq: Every day | ORAL | 2 refills | Status: DC

## 2021-09-24 LAB — TESTOSTERONE,FREE AND TOTAL
Testosterone, Free: 3.1 pg/mL — ABNORMAL LOW (ref 6.6–18.1)
Testosterone: 343 ng/dL (ref 264–916)

## 2021-09-25 ENCOUNTER — Other Ambulatory Visit: Payer: Self-pay | Admitting: Family Medicine

## 2021-09-25 DIAGNOSIS — E291 Testicular hypofunction: Secondary | ICD-10-CM

## 2021-09-25 MED ORDER — TESTOSTERONE 40.5 MG/2.5GM (1.62%) TD GEL: 2.5000 g | Freq: Every day | TRANSDERMAL | 1 refills | Status: DC

## 2021-10-14 ENCOUNTER — Telehealth: Payer: Self-pay | Admitting: Family Medicine

## 2021-10-14 NOTE — Telephone Encounter (Signed)
As per pharmacy Testosterone (ANDROGEL) 40.5 MG/2.5GM (1.62%) GEL is need of a prior authorization, once PA is approved pharmacy will order medication.    Patient unsure if PCP would like him to keep his 10/20/2021 follow up appointment or push appointment further out.    [id number] [address], [place] Phone:  [phone]  Fax:  [phone]

## 2021-10-20 ENCOUNTER — Encounter: Payer: Self-pay | Admitting: Family Medicine

## 2021-10-20 ENCOUNTER — Ambulatory Visit: Payer: Managed Care, Other (non HMO) | Admitting: Family Medicine

## 2021-10-20 VITALS — BP 146/93 | HR 72 | Temp 97.8°F | Resp 16 | Wt 198.0 lb

## 2021-10-20 DIAGNOSIS — I10 Essential (primary) hypertension: Secondary | ICD-10-CM

## 2021-10-20 DIAGNOSIS — E291 Testicular hypofunction: Secondary | ICD-10-CM | POA: Diagnosis not present

## 2021-10-20 DIAGNOSIS — E7800 Pure hypercholesterolemia, unspecified: Secondary | ICD-10-CM | POA: Diagnosis not present

## 2021-10-20 DIAGNOSIS — Z23 Encounter for immunization: Secondary | ICD-10-CM | POA: Diagnosis not present

## 2021-10-20 DIAGNOSIS — E119 Type 2 diabetes mellitus without complications: Secondary | ICD-10-CM | POA: Diagnosis not present

## 2021-10-20 MED ORDER — TESTOSTERONE 40.5 MG/2.5GM (1.62%) TD GEL: 2.5000 g | Freq: Every day | TRANSDERMAL | 2 refills | Status: DC

## 2021-10-20 NOTE — Progress Notes (Signed)
I,Tyrone Wang,acting as a scribe for Tyrone Huh, MD.,have documented all relevant documentation on the behalf of Tyrone Huh, MD,as directed by  Tyrone Huh, MD while in the presence of Tyrone Huh, MD.   Established patient visit   Patient: Tyrone Wang   DOB: January 25, 1955   66 y.o. Male  MRN: [id number] Visit Date: 10/20/2021  Today's healthcare provider: [name], MD   Chief Complaint  Patient presents with   Diabetes   Hypertension   low testosterone   Subjective    HPI  Hypertension, follow-up  BP Readings from Last 3 Encounters:  10/20/21 (!) 146/93  09/02/21 (!) 138/108  03/22/20 (!) 149/94   Wt Readings from Last 3 Encounters:  10/20/21 198 lb (89.8 kg)  09/02/21 199 lb 12.8 oz (90.6 kg)  03/22/20 188 lb (85.3 kg)     He was last seen for hypertension 6 weeks ago.  BP at that visit was 138/108.  He reports good compliance with treatment. He is not having side effects.  He is following a Regular diet. He is not exercising. He does not smoke.  Use of agents associated with hypertension: NSAIDS.   Outside blood pressures are checked occasionally. Symptoms: No chest pain No chest pressure  No palpitations No syncope  No dyspnea No orthopnea  No paroxysmal nocturnal dyspnea No lower extremity edema   Pertinent labs Lab Results  Component Value Date   NA 142 09/02/2021   K 4.6 09/02/2021   CREATININE 1.04 09/02/2021   EGFR 81 09/02/2021   GLUCOSE 151 (H) 09/02/2021   TSH 1.780 09/02/2021       ---------------------------------------------------------------------------------------------------   Follow up for low testosterone:  The patient was last seen for this 1  month  ago. Changes made at last visit include starting Androgel.  He reports poor compliance with treatment. Patient states the pharmacy did not fill his prescription. He feels that condition is Unchanged. He is not having side effects.    -----------------------------------------------------------------------------------------   Lipid/Cholesterol, Follow-up  Last lipid panel Other pertinent labs  Lab Results  Component Value Date   CHOL 249 (H) 09/02/2021   HDL 58 09/02/2021   LDLCALC 159 (H) 09/02/2021   TRIG 179 (H) 09/02/2021   CHOLHDL 4.3 09/02/2021   Lab Results  Component Value Date   ALT 42 09/02/2021   AST 35 09/02/2021   PLT 203 09/02/2021   TSH 1.780 09/02/2021     He was last seen for this 6 weeks ago.  Management since that visit includes starting rosuvastatin.  He reports good compliance with treatment. He is not having side effects.   Symptoms: No chest pain No chest pressure/discomfort  No dyspnea No lower extremity edema  No numbness or tingling of extremity No orthopnea  No palpitations No paroxysmal nocturnal dyspnea  No speech difficulty No syncope   Current diet: well balanced Current exercise: none  The 10-year ASCVD risk score (Arnett DK, et al., 2019) is: 26.4%  ---------------------------------------------------------------------------------------------------   Diabetes Mellitus Type II, Follow-up  Lab Results  Component Value Date   HGBA1C 8.7 (H) 09/02/2021   Wt Readings from Last 3 Encounters:  10/20/21 198 lb (89.8 kg)  09/02/21 199 lb 12.8 oz (90.6 kg)  03/22/20 188 lb (85.3 kg)   Last seen for diabetes 6 weeks ago.  Management since then includes starting metformin ER 570m once a day for diabetes. He reports good compliance with treatment. He is not having side effects.  Symptoms: No  fatigue No foot ulcerations  No appetite changes No nausea  No paresthesia of the feet  No polydipsia  No polyuria No visual disturbances   No vomiting     Home blood sugar records:  blood sugars are not checked  Episodes of hypoglycemia? No    Current insulin regiment: none Most Recent Eye Exam: <1 year ago Current exercise: none Current diet habits: well  balanced  Pertinent Labs: Lab Results  Component Value Date   CHOL 249 (H) 09/02/2021   HDL 58 09/02/2021   LDLCALC 159 (H) 09/02/2021   TRIG 179 (H) 09/02/2021   CHOLHDL 4.3 09/02/2021   Lab Results  Component Value Date   NA 142 09/02/2021   K 4.6 09/02/2021   CREATININE 1.04 09/02/2021   EGFR 81 09/02/2021     ---------------------------------------------------------------------------------------------------   Medications: Outpatient Medications Prior to Visit  Medication Sig   aspirin EC 81 MG tablet Take 81 mg by mouth daily. Swallow whole.   metFORMIN (GLUCOPHAGE-XR) 500 MG 24 hr tablet Take 1 tablet (500 mg total) by mouth daily with breakfast.   rosuvastatin (CRESTOR) 5 MG tablet Take 1 tablet (5 mg total) by mouth daily.   Testosterone (ANDROGEL) 40.5 MG/2.5GM (1.62%) GEL Place 2.5 g onto the skin daily. (Patient not taking: Reported on 10/20/2021)   No facility-administered medications prior to visit.    Review of Systems  Constitutional:  Negative for appetite change, chills and fever.  Respiratory:  Negative for chest tightness, shortness of breath and wheezing.   Cardiovascular:  Negative for chest pain and palpitations.  Gastrointestinal:  Negative for abdominal pain, nausea and vomiting.       Objective    BP (!) 146/93 (BP Location: Right Arm, Patient Position: Sitting, Cuff Size: Large)   Pulse 72   Temp 97.8 F (36.6 C) (Oral)   Resp 16   Wt 198 lb (89.8 kg)   SpO2 97% Comment: room air  BMI 29.24 kg/m    Today's Vitals   10/20/21 0832 10/20/21 0837  BP: (!) 152/87 (!) 146/93  Pulse: 72   Resp: 16   Temp: 97.8 F (36.6 C)   TempSrc: Oral   SpO2: 97%   Weight: 198 lb (89.8 kg)    Body mass index is 29.24 kg/m.    Assessment & Plan     1. Diabetes mellitus without complication ([place]) Recently diagnosed. Doing well with initiation of metformin. Discussed diet improvements.   - Hemoglobin A1c - Amb ref to Medical Nutrition  Therapy-MNT  2. Pure hypercholesterolemia He is tolerating rosuvastatin well with no adverse effects.   - Comprehensive metabolic panel - Lipid panel  3. Primary hypertension Expect improved controlled as he goes on diabetic diet. Consider starting medication if not improved at follow up  4. Hypogonadism in male Has not yet started testosterone replacement. He wants to try a different pharmacy. New rx Testosterone (ANDROGEL) 40.5 MG/2.5GM (1.62%) GEL; Place 2.5 g onto the skin daily.  Dispense: 75 g; Refill: 2 sent to [facility].         The entirety of the information documented in the History of Present Illness, Review of Systems and Physical Exam were personally obtained by me. Portions of this information were initially documented by the CMA and reviewed by me for thoroughness and accuracy.     Tyrone Huh, MD  [facility] [phone] (phone) [phone] (fax)  [place]

## 2021-10-20 NOTE — Progress Notes (Deleted)
Established patient visit   Patient: Tyrone Wang   DOB: December 26, 1955   66 y.o. Male  MRN: [id number] Visit Date: 10/20/2021  Today's healthcare provider: [name], MD   No chief complaint on file.  Subjective    HPI  Diabetes Mellitus Type II, Follow-up  Lab Results  Component Value Date   HGBA1C 8.7 (H) 09/02/2021   Wt Readings from Last 3 Encounters:  09/02/21 199 lb 12.8 oz (90.6 kg)  03/22/20 188 lb (85.3 kg)  06/26/16 192 lb (87.1 kg)   Last seen for diabetes 7 weeks ago.  Management since then includes start metformin ER 520m once a day . He reports {excellent/good/fair/poor:19665} compliance with treatment. He {is/is not:21021397} having side effects. {document side effects if present:1} Symptoms: {Yes/No:20286} fatigue {Yes/No:20286} foot ulcerations  {Yes/No:20286} appetite changes {Yes/No:20286} nausea  {Yes/No:20286} paresthesia of the feet  {Yes/No:20286} polydipsia  {Yes/No:20286} polyuria {Yes/No:20286} visual disturbances   {Yes/No:20286} vomiting     Home blood sugar records: {diabetes glucometry results:16657}  Episodes of hypoglycemia? {Yes/No:20286} {enter symptoms and frequency of symptoms if yes:1}   Current insulin regiment: {enter 'none' or type of insulin and number of units taken with each dose of each insulin formulation that the patient is taking:1} Most Recent Eye Exam: *** {Current exercise:16438:::1} {Current diet habits:16563:::1}  Pertinent Labs: Lab Results  Component Value Date   CHOL 249 (H) 09/02/2021   HDL 58 09/02/2021   LDLCALC 159 (H) 09/02/2021   TRIG 179 (H) 09/02/2021   CHOLHDL 4.3 09/02/2021   Lab Results  Component Value Date   NA 142 09/02/2021   K 4.6 09/02/2021   CREATININE 1.04 09/02/2021   EGFR 81 09/02/2021     ---------------------------------------------------------------------------------------------------  Lipid/Cholesterol, Follow-up  Last lipid panel Other pertinent labs  Lab  Results  Component Value Date   CHOL 249 (H) 09/02/2021   HDL 58 09/02/2021   LDLCALC 159 (H) 09/02/2021   TRIG 179 (H) 09/02/2021   CHOLHDL 4.3 09/02/2021   Lab Results  Component Value Date   ALT 42 09/02/2021   AST 35 09/02/2021   PLT 203 09/02/2021   TSH 1.780 09/02/2021     He was last seen for this 7 weeks ago.  Management since that visit includes start rosuvastatin 555monce a day.  He reports {excellent/good/fair/poor:19665} compliance with treatment. He {is/is not:9024} having side effects. {document side effects if present:1}  Symptoms: {Yes/No:20286} chest pain {Yes/No:20286} chest pressure/discomfort  {Yes/No:20286} dyspnea {Yes/No:20286} lower extremity edema  {Yes/No:20286} numbness or tingling of extremity {Yes/No:20286} orthopnea  {Yes/No:20286} palpitations {Yes/No:20286} paroxysmal nocturnal dyspnea  {Yes/No:20286} speech difficulty {Yes/No:20286} syncope   Current diet: {diet habits:16563} Current exercise: {exercise types:16438}  The 10-year ASCVD risk score (Arnett DK, et al., 2019) is: 24.2%  ---------------------------------------------------------------------------------------------------   Medications: Outpatient Medications Prior to Visit  Medication Sig   metFORMIN (GLUCOPHAGE-XR) 500 MG 24 hr tablet Take 1 tablet (500 mg total) by mouth daily with breakfast.   rosuvastatin (CRESTOR) 5 MG tablet Take 1 tablet (5 mg total) by mouth daily.   Testosterone (ANDROGEL) 40.5 MG/2.5GM (1.62%) GEL Place 2.5 g onto the skin daily.   No facility-administered medications prior to visit.    Review of Systems  {Labs  Heme  Chem  Endocrine  Serology  Results Review (optional):23779}   Objective    There were no vitals taken for this visit. {Show previous vital signs (optional):23777}  Physical Exam  ***  No results found for any visits on 10/20/21.  Assessment & Plan     ***  No follow-ups on file.      {provider  attestation***:1}   [name], MD  [facility] [phone] (phone) [phone] (fax)  [place]

## 2021-10-22 ENCOUNTER — Other Ambulatory Visit: Payer: Self-pay | Admitting: Family Medicine

## 2021-10-22 DIAGNOSIS — E119 Type 2 diabetes mellitus without complications: Secondary | ICD-10-CM

## 2021-10-22 LAB — COMPREHENSIVE METABOLIC PANEL
ALT: 26 IU/L (ref 0–44)
AST: 23 IU/L (ref 0–40)
Albumin/Globulin Ratio: 2.2 (ref 1.2–2.2)
Albumin: 4.8 g/dL (ref 3.9–4.9)
Alkaline Phosphatase: 56 IU/L (ref 44–121)
BUN/Creatinine Ratio: 13 (ref 10–24)
BUN: 14 mg/dL (ref 8–27)
Bilirubin Total: 1.4 mg/dL — ABNORMAL HIGH (ref 0.0–1.2)
CO2: 22 mmol/L (ref 20–29)
Calcium: 9.8 mg/dL (ref 8.6–10.2)
Chloride: 101 mmol/L (ref 96–106)
Creatinine, Ser: 1.11 mg/dL (ref 0.76–1.27)
Globulin, Total: 2.2 g/dL (ref 1.5–4.5)
Glucose: 144 mg/dL — ABNORMAL HIGH (ref 70–99)
Potassium: 4.7 mmol/L (ref 3.5–5.2)
Sodium: 141 mmol/L (ref 134–144)
Total Protein: 7 g/dL (ref 6.0–8.5)
eGFR: 75 mL/min/{1.73_m2} (ref >59)

## 2021-10-22 LAB — LIPID PANEL
Chol/HDL Ratio: 2.8 ratio (ref 0.0–5.0)
Cholesterol, Total: 166 mg/dL (ref 100–199)
HDL: 59 mg/dL (ref >39)
LDL Chol Calc (NIH): 90 mg/dL (ref 0–99)
Triglycerides: 93 mg/dL (ref 0–149)
VLDL Cholesterol Cal: 17 mg/dL (ref 5–40)

## 2021-10-22 LAB — HEMOGLOBIN A1C
Est. average glucose Bld gHb Est-mCnc: 186 mg/dL
Hgb A1c MFr Bld: 8.1 % — ABNORMAL HIGH (ref 4.8–5.6)

## 2021-10-22 MED ORDER — METFORMIN HCL ER 500 MG PO TB24: 1000.0000 mg | ORAL_TABLET | Freq: Every day | ORAL | Status: DC

## 2021-10-22 NOTE — Telephone Encounter (Signed)
Pt is calling chking on PA below, was wanting to start med asap and checking on the status and asked if anything he could do to assist he will be glad to [phone]

## 2021-10-22 NOTE — Telephone Encounter (Signed)
Do you guys or anyone that has been working with Dr. Caryn Section know what is the update on this PA?

## 2021-10-22 NOTE — Telephone Encounter (Signed)
I saw that it was filled 2 days ago. I assumed the PA was done, but have not looked to make sure.

## 2021-10-22 NOTE — Telephone Encounter (Signed)
Just spoke with pharmacy staff and they have not gotten an approved PA for this medicine yet. Did either of you start one for him?

## 2021-10-28 NOTE — Telephone Encounter (Signed)
Pls contact pt, states not able to get his medication due to needing a PA. Pls fu as states he has not heard from dr re. [phone]

## 2021-11-03 ENCOUNTER — Other Ambulatory Visit: Payer: Self-pay

## 2021-11-03 ENCOUNTER — Ambulatory Visit: Payer: Managed Care, Other (non HMO) | Admitting: Family Medicine

## 2021-11-03 DIAGNOSIS — Z23 Encounter for immunization: Secondary | ICD-10-CM | POA: Diagnosis not present

## 2021-11-03 DIAGNOSIS — E119 Type 2 diabetes mellitus without complications: Secondary | ICD-10-CM

## 2021-11-03 DIAGNOSIS — E7800 Pure hypercholesterolemia, unspecified: Secondary | ICD-10-CM

## 2021-11-03 MED ORDER — METFORMIN HCL ER 500 MG PO TB24: 1000.0000 mg | ORAL_TABLET | Freq: Every day | ORAL | 1 refills | Status: DC

## 2021-11-03 MED ORDER — ROSUVASTATIN CALCIUM 5 MG PO TABS: 5.0000 mg | ORAL_TABLET | Freq: Every day | ORAL | 2 refills | Status: DC

## 2021-11-03 NOTE — Progress Notes (Signed)
Here today for nurse visit only. Administered 2nd Shingrix vaccine.

## 2021-12-09 ENCOUNTER — Other Ambulatory Visit: Payer: Self-pay | Admitting: Family Medicine

## 2021-12-09 DIAGNOSIS — E7800 Pure hypercholesterolemia, unspecified: Secondary | ICD-10-CM

## 2021-12-09 DIAGNOSIS — E119 Type 2 diabetes mellitus without complications: Secondary | ICD-10-CM

## 2022-02-04 ENCOUNTER — Ambulatory Visit: Payer: Managed Care, Other (non HMO) | Admitting: Family Medicine

## 2022-02-04 ENCOUNTER — Encounter: Payer: Self-pay | Admitting: Family Medicine

## 2022-02-04 VITALS — BP 138/84 | HR 66 | Temp 98.2°F | Resp 16 | Ht 69.0 in | Wt 189.5 lb

## 2022-02-04 DIAGNOSIS — E7800 Pure hypercholesterolemia, unspecified: Secondary | ICD-10-CM

## 2022-02-04 DIAGNOSIS — E119 Type 2 diabetes mellitus without complications: Secondary | ICD-10-CM

## 2022-02-04 DIAGNOSIS — I10 Essential (primary) hypertension: Secondary | ICD-10-CM

## 2022-02-04 DIAGNOSIS — Z136 Encounter for screening for cardiovascular disorders: Secondary | ICD-10-CM

## 2022-02-04 DIAGNOSIS — Z1211 Encounter for screening for malignant neoplasm of colon: Secondary | ICD-10-CM | POA: Diagnosis not present

## 2022-02-04 LAB — POCT GLYCOSYLATED HEMOGLOBIN (HGB A1C)
Est. average glucose Bld gHb Est-mCnc: 157
Hemoglobin A1C: 7.1 % — AB (ref 4.0–5.6)

## 2022-02-04 NOTE — Progress Notes (Signed)
I,Sulibeya S Dimas,acting as a scribe for [name], MD.,have documented all relevant documentation on the behalf of [name], MD,as directed by  [name], MD while in the presence of [name], MD.    Established patient visit   Patient: Tyrone Wang   DOB: 12-24-1955   67 y.o. Male  MRN: [id number] Visit Date: 02/04/2022  Today's healthcare provider: [name], MD   Chief Complaint  Patient presents with   Diabetes   Subjective    HPI  Diabetes Mellitus Type II, follow-up  Lab Results  Component Value Date   HGBA1C 7.1 (A) 02/04/2022   HGBA1C 8.1 (H) 10/21/2021   HGBA1C 8.7 (H) 09/02/2021   Last seen for diabetes 3 months ago.  Management since then includes continuing the same treatment. He reports fasting range: not being checked  compliance with treatment. He is not having side effects.   Home blood sugar records:  not being checked  Episodes of hypoglycemia? No    Current insulin regiment: none Most Recent Eye Exam: UTD. Report requested today.   --------------------------------------------------------------------------------------------------- Hypertension, follow-up  BP Readings from Last 3 Encounters:  02/04/22 138/84  10/20/21 (!) 146/93  09/02/21 (!) 138/108   Wt Readings from Last 3 Encounters:  02/04/22 189 lb 8 oz (86 kg)  10/20/21 198 lb (89.8 kg)  09/02/21 199 lb 12.8 oz (90.6 kg)     He was last seen for hypertension 3 months ago.  BP at that visit was 146/93. Management since that visit includes continuing treatment . He reports excellent compliance with treatment.   Outside blood pressures are not being checked.  --------------------------------------------------------------------------------------------------- Lipid/Cholesterol, follow-up  Last Lipid Panel: Lab Results  Component Value Date   CHOL 166 10/21/2021   [place] 90 10/21/2021   HDL 59 10/21/2021   TRIG 93 10/21/2021    He was last seen  for this 3 months ago.  Management since that visit includes continuing treatment.  He reports excellent compliance with treatment. He is not having side effects.   Last metabolic panel Lab Results  Component Value Date   GLUCOSE 144 (H) 10/21/2021   NA 141 10/21/2021   K 4.7 10/21/2021   BUN 14 10/21/2021   CREATININE 1.11 10/21/2021   EGFR 75 10/21/2021   GFRNONAA >60 07/26/2015   CALCIUM 9.8 10/21/2021   AST 23 10/21/2021   ALT 26 10/21/2021   The 10-year ASCVD risk score (Arnett DK, et al., 2019) is: 19.4%  ---------------------------------------------------------------------------------------------------   Medications: Outpatient Medications Prior to Visit  Medication Sig   aspirin EC 81 MG tablet Take 81 mg by mouth daily. Swallow whole.   MAGNESIUM-OXIDE PO Take by mouth.   metFORMIN (GLUCOPHAGE-XR) 500 MG 24 hr tablet TAKE 1 TABLET BY MOUTH EVERY DAY WITH BREAKFAST   Methylcobalamin (B12 SL) Place under the tongue.   Multiple Vitamin (MULTIVITAMIN) tablet Take 1 tablet by mouth daily.   rosuvastatin (CRESTOR) 5 MG tablet TAKE 1 TABLET (5 MG TOTAL) BY MOUTH DAILY.   Testosterone (ANDROGEL) 40.5 MG/2.5GM (1.62%) GEL Place 2.5 g onto the skin daily.   No facility-administered medications prior to visit.    Review of Systems  Constitutional:  Negative for appetite change and fatigue.  Eyes:  Negative for visual disturbance.  Respiratory:  Negative for chest tightness and shortness of breath.   Cardiovascular:  Negative for chest pain and leg swelling.  Gastrointestinal:  Negative for abdominal pain, nausea and vomiting.  Neurological:  Negative for dizziness, light-headedness  and headaches.       Objective    BP 138/84 (BP Location: Left Arm, Patient Position: Sitting, Cuff Size: Large)   Pulse 66   Temp 98.2 F (36.8 C) (Temporal)   Resp 16   Ht 5\' 9"  (1.753 m)   Wt 189 lb 8 oz (86 kg)   BMI 27.98 kg/m  BP Readings from Last 3 Encounters:  02/04/22  138/84  10/20/21 (!) 146/93  09/02/21 (!) 138/108   Wt Readings from Last 3 Encounters:  02/04/22 189 lb 8 oz (86 kg)  10/20/21 198 lb (89.8 kg)  09/02/21 199 lb 12.8 oz (90.6 kg)   Physical Exam   General appearance: Well developed, well nourished male, cooperative and in no acute distress Head: Normocephalic, without obvious abnormality, atraumatic Respiratory: Respirations even and unlabored, normal respiratory rate Extremities: All extremities are intact.  Skin: Skin color, texture, turgor normal. No rashes seen  Psych: Appropriate mood and affect. Neurologic: Mental status: Alert, oriented to person, place, and time, thought content appropriate.   Results for orders placed or performed in visit on 02/04/22  POCT glycosylated hemoglobin (Hb A1C)  Result Value Ref Range   Hemoglobin A1C 7.1 (A) 4.0 - 5.6 %   Est. average glucose Bld gHb Est-mCnc 157     Assessment & Plan     1. Type 2 diabetes mellitus without complication, without long-term current use of insulin (HCC) Well controlled with lifestyle modifications, metformin and weight loss.   2. Pure hypercholesterolemia He is tolerating rosuvastatin well with no adverse effects.    He is concerned about undiagnosed CAD although having no symptoms.  - CT CARDIAC SCORING (SELF PAY ONLY); Future  3. Primary hypertension Much better with lifestyle modifications since last visit.   4. Colon cancer screening  - Cologuard  5. Encounter for screening for coronary artery disease  - CT CARDIAC SCORING (SELF PAY ONLY); Future  Follow up in 3 months.      The entirety of the information documented in the History of Present Illness, Review of Systems and Physical Exam were personally obtained by me. Portions of this information were initially documented by the CMA and reviewed by me for thoroughness and accuracy.     [name], MD  [facility] [phone] (phone) [phone] (fax)  [place]

## 2022-02-11 ENCOUNTER — Ambulatory Visit
Admission: RE | Admit: 2022-02-11 | Discharge: 2022-02-11 | Disposition: A | Discharge status: Home / Self Care | Payer: Managed Care, Other (non HMO) | Source: Ambulatory Visit | Attending: Family Medicine | Admitting: Family Medicine

## 2022-02-11 DIAGNOSIS — E7800 Pure hypercholesterolemia, unspecified: Secondary | ICD-10-CM | POA: Insufficient documentation

## 2022-02-11 DIAGNOSIS — Z136 Encounter for screening for cardiovascular disorders: Secondary | ICD-10-CM | POA: Insufficient documentation

## 2022-02-12 ENCOUNTER — Other Ambulatory Visit: Payer: Self-pay | Admitting: Family Medicine

## 2022-02-12 DIAGNOSIS — E291 Testicular hypofunction: Secondary | ICD-10-CM

## 2022-02-13 NOTE — Telephone Encounter (Signed)
Requested medications are due for refill today. Provider to determine.  Requested medications are on the active medications list.  yes  Last refill. 10/20/2021 75 2 rf  Future visit scheduled.   yes  Notes to clinic.  Refill not delegated    Requested Prescriptions  Pending Prescriptions Disp Refills   Testosterone 40.5 MG/2.5GM (1.62%) GEL [Pharmacy Med Name: TESTOSTERONE 1.62% (2.5 G) PKT[!]] 75 packet 2    Sig: PLACE 2.5 GRAM(S) ONTO THE SKIN DAILY     Off-Protocol Failed - 02/12/2022  5:03 PM      Failed - Medication not assigned to a protocol, review manually.      Passed - Valid encounter within last 12 months    Recent Outpatient Visits           1 week ago Type 2 diabetes mellitus without complication, without long-term current use of insulin ([place])   [place], MD   3 months ago Need for shingles vaccine   [facility] Birdie Sons, MD   3 months ago Diabetes mellitus without complication [facility])   [place], Donald E, MD   5 months ago Annual physical exam   [facility] Birdie Sons, MD   1 year ago Pain of left thigh   [place], Donald E, MD       Future Appointments             In 2 months Fisher, Kirstie Peri, MD [facility]

## 2022-02-15 ENCOUNTER — Other Ambulatory Visit: Payer: Self-pay | Admitting: Family Medicine

## 2022-02-15 ENCOUNTER — Encounter: Payer: Self-pay | Admitting: Family Medicine

## 2022-02-15 DIAGNOSIS — I712 Thoracic aortic aneurysm, without rupture, unspecified: Secondary | ICD-10-CM | POA: Insufficient documentation

## 2022-02-15 DIAGNOSIS — I2510 Atherosclerotic heart disease of native coronary artery without angina pectoris: Secondary | ICD-10-CM | POA: Insufficient documentation

## 2022-02-15 DIAGNOSIS — E7800 Pure hypercholesterolemia, unspecified: Secondary | ICD-10-CM

## 2022-02-15 MED ORDER — ROSUVASTATIN CALCIUM 10 MG PO TABS: 10.0000 mg | ORAL_TABLET | Freq: Every day | ORAL | 3 refills | Status: DC

## 2022-03-17 LAB — COLOGUARD: COLOGUARD: NEGATIVE

## 2022-05-05 NOTE — Progress Notes (Unsigned)
I,Sulibeya S Dimas,acting as a scribe for Mila Merry, MD.,have documented all relevant documentation on the behalf of Mila Merry, MD,as directed by  Mila Merry, MD while in the presence of Mila Merry, MD.     Established patient visit   Patient: Tyrone Wang   DOB: August 10, 1955   67 y.o. Male  MRN: [id number] Visit Date: 05/06/2022  Today's healthcare provider: Mila Merry, MD   Chief Complaint  Patient presents with   Diabetes   Hypertension   Hyperlipidemia   Subjective    HPI  Diabetes Mellitus Type II, follow-up  Lab Results  Component Value Date   HGBA1C 6.9 (A) 05/06/2022   HGBA1C 7.1 (A) 02/04/2022   HGBA1C 8.1 (H) 10/21/2021   Last seen for diabetes 3 months ago.  Management since then includes continuing the same treatment. He reports excellent compliance with treatment. He is not having side effects.   Home blood sugar records:  not being checked  Episodes of hypoglycemia? No    Current insulin regiment: none Most Recent Eye Exam: UTD  --------------------------------------------------------------------------------------------------- Hypertension, follow-up  BP Readings from Last 3 Encounters:  05/06/22 129/87  02/04/22 138/84  10/20/21 (!) 146/93   Wt Readings from Last 3 Encounters:  05/06/22 187 lb 14.4 oz (85.2 kg)  02/04/22 189 lb 8 oz (86 kg)  10/20/21 198 lb (89.8 kg)     He was last seen for hypertension 3 months ago.  BP at that visit was 138/84. Management since that visit includes no changes. He reports excellent compliance with treatment. He is not having side effects.   Outside blood pressures are not being checked. --------------------------------------------------------------------------------------------------- Lipid/Cholesterol, follow-up  Last Lipid Panel: Lab Results  Component Value Date   CHOL 166 10/21/2021   LDLCALC 90 10/21/2021   HDL 59 10/21/2021   TRIG 93 10/21/2021    He was last seen for  this 3 months ago.  Management since that visit includes increasing rosuvastatin to  a day due to elevated coronary calcium score. He was also noted to have slight aneurysmal dilation of ascending thoracic aorta for which CTA or MRA is recommended for follow up in January of 2025.   He reports excellent compliance with treatment. He is not having side effects.   Last metabolic panel Lab Results  Component Value Date   GLUCOSE 144 (H) 10/21/2021   NA 141 10/21/2021   K 4.7 10/21/2021   BUN 14 10/21/2021   CREATININE 1.11 10/21/2021   EGFR 75 10/21/2021   GFRNONAA >60 07/26/2015   CALCIUM 9.8 10/21/2021   AST 23 10/21/2021   ALT 26 10/21/2021   The 10-year ASCVD risk score (Arnett DK, et al., 2019) is: 17.5%  ---------------------------------------------------------------------------------------------------  He also reports swelling and soreness in the MCPs of his left hand the last few months.   Medications: Outpatient Medications Prior to Visit  Medication Sig   aspirin EC 81 MG tablet Take 81 mg by mouth daily. Swallow whole.   MAGNESIUM-OXIDE PO Take by mouth.   metFORMIN (GLUCOPHAGE-XR) 500 MG 24 hr tablet TAKE 1 TABLET BY MOUTH EVERY DAY WITH BREAKFAST   Methylcobalamin (B12 SL) Place under the tongue.   Multiple Vitamin (MULTIVITAMIN) tablet Take 1 tablet by mouth daily.   rosuvastatin (CRESTOR) 10 MG tablet Take 1 tablet (10 mg total) by mouth daily.   Testosterone 40.5 MG/2.5GM (1.62%) GEL PLACE 2.5 GRAM(S) ONTO THE SKIN DAILY   No facility-administered medications prior to visit.    Review of Systems  Constitutional:  Negative for appetite change and fatigue.  Eyes:  Negative for visual disturbance.  Respiratory:  Negative for chest tightness and shortness of breath.   Cardiovascular:  Negative for chest pain and leg swelling.  Gastrointestinal:  Negative for abdominal pain, nausea and vomiting.  Neurological:  Negative for dizziness, light-headedness and  headaches.       Objective    BP 129/87 (BP Location: Left Arm, Patient Position: Sitting, Cuff Size: Large)   Pulse 78   Temp 97.7 F (36.5 C) (Temporal)   Resp 12   Wt 187 lb 14.4 oz (85.2 kg)   SpO2 99%   BMI 27.75 kg/m    Physical Exam    General: Appearance:    Well developed, well nourished male in no acute distress  Eyes:    PERRL, conjunctiva/corneas clear, EOM's intact       Lungs:     Clear to auscultation bilaterally, respirations unlabored  Heart:    Normal heart rate. Normal rhythm. No murmurs, rubs, or gallops.    MS:   All extremities are intact.  Mild swelling of MCPs of left hand, no erythema or other deformities.   Neurologic:   Awake, alert, oriented x 3. No apparent focal neurological defect.         Results for orders placed or performed in visit on 05/06/22  POCT glycosylated hemoglobin (Hb A1C)  Result Value Ref Range   Hemoglobin A1C 6.9 (A) 4.0 - 5.6 %   Est. average glucose Bld gHb Est-mCnc 151     Assessment & Plan     1. Type 2 diabetes mellitus with hyperlipidemia Doing well with diet, exercise, and metformin BID. Goal is to get A1c under 6.5% - Urine Albumin/Creatinine with ratio (send out) [LAB689] - Lipid panel - Comprehensive metabolic panel  2. Coronary artery calcification of native artery Doing well with increase dose of rosuvastatin. Goal is to get LDL at least below 70 and continue  ECASA.   3. Aneurysm of ascending aorta without rupture MRA or CTA in January 2025. Goal is to get BP<130/80  4. Primary hypertension Doing well with diet and exercise. Consider adding ARB is SBP stays over 130 or DBP over 80  5. Arthralgia of left hand Try OTC diclofenac 1% gel.   Follow up CPE in August.       The entirety of the information documented in the History of Present Illness, Review of Systems and Physical Exam were personally obtained by me. Portions of this information were initially documented by the CMA and reviewed by me  for thoroughness and accuracy.     Mila Merry, MD  [facility] [phone] (phone) [phone] (fax)  [facility]

## 2022-05-06 ENCOUNTER — Ambulatory Visit: Payer: Managed Care, Other (non HMO) | Admitting: Family Medicine

## 2022-05-06 VITALS — BP 129/87 | HR 78 | Temp 97.7°F | Resp 12 | Wt 187.9 lb

## 2022-05-06 DIAGNOSIS — E1169 Type 2 diabetes mellitus with other specified complication: Secondary | ICD-10-CM | POA: Diagnosis not present

## 2022-05-06 DIAGNOSIS — I2510 Atherosclerotic heart disease of native coronary artery without angina pectoris: Secondary | ICD-10-CM

## 2022-05-06 DIAGNOSIS — M25542 Pain in joints of left hand: Secondary | ICD-10-CM

## 2022-05-06 DIAGNOSIS — E119 Type 2 diabetes mellitus without complications: Secondary | ICD-10-CM

## 2022-05-06 DIAGNOSIS — I7121 Aneurysm of the ascending aorta, without rupture: Secondary | ICD-10-CM

## 2022-05-06 DIAGNOSIS — E785 Hyperlipidemia, unspecified: Secondary | ICD-10-CM

## 2022-05-06 DIAGNOSIS — I10 Essential (primary) hypertension: Secondary | ICD-10-CM

## 2022-05-06 DIAGNOSIS — I2584 Coronary atherosclerosis due to calcified coronary lesion: Secondary | ICD-10-CM

## 2022-05-06 LAB — POCT GLYCOSYLATED HEMOGLOBIN (HGB A1C)
Est. average glucose Bld gHb Est-mCnc: 151
Hemoglobin A1C: 6.9 % — AB (ref 4.0–5.6)

## 2022-05-06 NOTE — Patient Instructions (Addendum)
Please review the attached list of medications and notify my office if there are any errors.   Please bring all of your medications to every appointment so we can make sure that our medication list is the same as yours.   I recommend you try OTC Voltaren Gel (generic 1% diclofenac) for arthritis in you knuckles

## 2022-05-07 LAB — COMPREHENSIVE METABOLIC PANEL
ALT: 18 IU/L (ref 0–44)
AST: 18 IU/L (ref 0–40)
Albumin/Globulin Ratio: 2.5 — ABNORMAL HIGH (ref 1.2–2.2)
Albumin: 4.9 g/dL (ref 3.9–4.9)
Alkaline Phosphatase: 57 IU/L (ref 44–121)
BUN/Creatinine Ratio: 16 (ref 10–24)
BUN: 16 mg/dL (ref 8–27)
Bilirubin Total: 2 mg/dL — ABNORMAL HIGH (ref 0.0–1.2)
CO2: 23 mmol/L (ref 20–29)
Calcium: 9.6 mg/dL (ref 8.6–10.2)
Chloride: 103 mmol/L (ref 96–106)
Creatinine, Ser: 1.03 mg/dL (ref 0.76–1.27)
Globulin, Total: 2 g/dL (ref 1.5–4.5)
Glucose: 136 mg/dL — ABNORMAL HIGH (ref 70–99)
Potassium: 4.3 mmol/L (ref 3.5–5.2)
Sodium: 142 mmol/L (ref 134–144)
Total Protein: 6.9 g/dL (ref 6.0–8.5)
eGFR: 81 mL/min/{1.73_m2} (ref >59)

## 2022-05-07 LAB — LIPID PANEL
Chol/HDL Ratio: 2.3 ratio (ref 0.0–5.0)
Cholesterol, Total: 152 mg/dL (ref 100–199)
HDL: 66 mg/dL (ref >39)
LDL Chol Calc (NIH): 70 mg/dL (ref 0–99)
Triglycerides: 82 mg/dL (ref 0–149)
VLDL Cholesterol Cal: 16 mg/dL (ref 5–40)

## 2022-05-07 LAB — MICROALBUMIN / CREATININE URINE RATIO
Creatinine, Urine: 160.1 mg/dL
Microalb/Creat Ratio: 21 mg/g creat (ref 0–29)
Microalbumin, Urine: 34.3 ug/mL

## 2022-05-11 ENCOUNTER — Other Ambulatory Visit: Payer: Self-pay | Admitting: Family Medicine

## 2022-05-11 DIAGNOSIS — E119 Type 2 diabetes mellitus without complications: Secondary | ICD-10-CM

## 2022-08-26 LAB — HM DIABETES EYE EXAM

## 2022-09-07 ENCOUNTER — Encounter: Payer: Managed Care, Other (non HMO) | Admitting: Family Medicine

## 2022-10-20 ENCOUNTER — Encounter: Payer: Managed Care, Other (non HMO) | Admitting: Family Medicine

## 2023-01-27 ENCOUNTER — Encounter: Payer: Managed Care, Other (non HMO) | Admitting: Family Medicine

## 2023-05-24 ENCOUNTER — Encounter: Payer: Self-pay | Admitting: Family Medicine

## 2023-09-24 ENCOUNTER — Encounter: Admitting: Family Medicine

## 2023-10-06 LAB — HM DIABETES EYE EXAM

## 2023-11-05 ENCOUNTER — Encounter: Payer: Self-pay | Admitting: Family Medicine

## 2023-11-05 ENCOUNTER — Ambulatory Visit (INDEPENDENT_AMBULATORY_CARE_PROVIDER_SITE_OTHER): Admitting: Family Medicine

## 2023-11-05 VITALS — BP 140/90 | HR 66 | Resp 16 | Ht 69.0 in | Wt 194.8 lb

## 2023-11-05 DIAGNOSIS — I2510 Atherosclerotic heart disease of native coronary artery without angina pectoris: Secondary | ICD-10-CM

## 2023-11-05 DIAGNOSIS — E1169 Type 2 diabetes mellitus with other specified complication: Secondary | ICD-10-CM

## 2023-11-05 DIAGNOSIS — I10 Essential (primary) hypertension: Secondary | ICD-10-CM | POA: Diagnosis not present

## 2023-11-05 DIAGNOSIS — M25512 Pain in left shoulder: Secondary | ICD-10-CM

## 2023-11-05 DIAGNOSIS — E291 Testicular hypofunction: Secondary | ICD-10-CM

## 2023-11-05 DIAGNOSIS — Z23 Encounter for immunization: Secondary | ICD-10-CM | POA: Diagnosis not present

## 2023-11-05 DIAGNOSIS — E7800 Pure hypercholesterolemia, unspecified: Secondary | ICD-10-CM | POA: Diagnosis not present

## 2023-11-05 DIAGNOSIS — Z125 Encounter for screening for malignant neoplasm of prostate: Secondary | ICD-10-CM

## 2023-11-05 DIAGNOSIS — G8929 Other chronic pain: Secondary | ICD-10-CM

## 2023-11-05 DIAGNOSIS — Z0000 Encounter for general adult medical examination without abnormal findings: Secondary | ICD-10-CM

## 2023-11-05 DIAGNOSIS — I7121 Aneurysm of the ascending aorta, without rupture: Secondary | ICD-10-CM

## 2023-11-05 DIAGNOSIS — I2584 Coronary atherosclerosis due to calcified coronary lesion: Secondary | ICD-10-CM

## 2023-11-05 DIAGNOSIS — Z0001 Encounter for general adult medical examination with abnormal findings: Secondary | ICD-10-CM | POA: Diagnosis not present

## 2023-11-05 NOTE — Progress Notes (Signed)
 Complete physical exam   Patient: Tyrone Wang   DOB: [birth year]   68 y.o. Male  MRN: [id number] Visit Date: 11/05/2023  Today's healthcare provider: Nancyann Perry, MD   Chief Complaint  Patient presents with  . Annual Exam   Subjective    Discussed the use of AI scribe software for clinical note transcription with the patient, who gave verbal consent to proceed.  History of Present Illness   Tyrone Wang is a 69 year old male who presents for an annual physical exam.  He has been off metformin  and statin for some time, opting for a natural approach. He has lost 12 pounds, improved his diet, and takes berberine. He does not monitor his blood sugar at home.  He has discontinued testosterone  and feels better without medications. He exercises regularly, walking 2-3 miles six days a week, weight training at work, and practicing tai chi.  He was hospitalized for 10 days with COVID-19 two years ago while visiting his sister in [place] . He performs breathing exercises and has no shortness of breath or wheezing during exercise.  He has a history of a ascending throcic aneurysm and elevated coronary artery calcifications, with a score of 150. He has made lifestyle changes and feels better but is concerned about the aneurysm and coronary artery status.  He experiences persistent shoulder discomfort, sometimes radiating down the arm and chest, which improves with stretching.  He has a sebaceous cyst on the back of his neck that has not changed in size or caused pain.  He does not consistently check his blood pressure at home.     Lab Results  Component Value Date   CHOL 152 05/06/2022   HDL 66 05/06/2022   LDLCALC 70 05/06/2022   TRIG 82 05/06/2022   CHOLHDL 2.3 05/06/2022   Lab Results  Component Value Date   HGBA1C 6.9 (A) 05/06/2022   HGBA1C 7.1 (A) 02/04/2022   HGBA1C 8.1 (H) 10/21/2021   Lab Results  Component Value Date   NA 142 05/06/2022   K 4.3  05/06/2022   CREATININE 1.03 05/06/2022   EGFR 81 05/06/2022   GLUCOSE 136 (H) 05/06/2022       Past Medical History:  Diagnosis Date  . Arthritis   . COVID-19 11/2020   Required 10 day hospitalization while travelling in the [place]  . Diverticulosis   . GERD (gastroesophageal reflux disease)   . History of chicken pox   . History of measles   . History of mumps   . Kidney stones   . Sleep apnea    Past Surgical History:  Procedure Laterality Date  . LITHOTRIPSY  2011  . TONSILLECTOMY  1976  . UPPER GASTROINTESTINAL ENDOSCOPY  11/25/2012   [facility]; Dr. Jeri; Normal Esophagus. -A single small papule Hyperpasia nodule.- Non bleeding erosive gastropathy= Chronic gastritis on Biopsy. -Erythematous Duodenopathy. No H. Pylori. Repeat 11/2013 per Dr. Rein  . WISDOM TOOTH EXTRACTION     Social History   Socioeconomic History  . Marital status: Divorced    Spouse name: Not on file  . Number of children: 0  . Years of education: Not on file  . Highest education level: Bachelor's degree (e.g., BA, AB, BS)  Occupational History  . Occupation: Location manager  Tobacco Use  . Smoking status: Never  . Smokeless tobacco: Never  Substance and Sexual Activity  . Alcohol use: Yes  . Drug use: No  . Sexual activity: Yes    Birth  control/protection: Condom  Other Topics Concern  . Not on file  Social History Narrative  . Not on file   Social Drivers of Health   Financial Resource Strain: Low Risk  (11/01/2023)   Overall Financial Resource Strain (CARDIA)   . Difficulty of Paying Living Expenses: Not hard at all  Food Insecurity: No Food Insecurity (11/01/2023)   Hunger Vital Sign   . Worried About Programme researcher, broadcasting/film/video in the Last Year: Never true   . Ran Out of Food in the Last Year: Never true  Transportation Needs: No Transportation Needs (11/01/2023)   PRAPARE - Transportation   . Lack of Transportation (Medical): No   . Lack of Transportation (Non-Medical): No  Physical  Activity: Sufficiently Active (11/01/2023)   Exercise Vital Sign   . Days of Exercise per Week: 5 days   . Minutes of Exercise per Session: 60 min  Stress: No Stress Concern Present (11/01/2023)   [employer] of Occupational Health - Occupational Stress Questionnaire   . Feeling of Stress: Not at all  Social Connections: Socially Isolated (11/01/2023)   Social Connection and Isolation Panel   . Frequency of Communication with Friends and Family: Never   . Frequency of Social Gatherings with Friends and Family: Never   . Attends Religious Services: Never   . Active Member of Clubs or Organizations: No   . Attends Banker Meetings: Not on file   . Marital Status: Divorced  Catering manager Violence: Not on file   Family Status  Relation Name Status  . Mother Ginnie Searles  . Father  Alive  . Sister  Alive  . Brother  Alive  . Sister  Alive  No partnership data on file   Family History  Problem Relation Age of Onset  . Arthritis Mother   . Diabetes Father        type 2   No Known Allergies  Patient Care Team: Gasper Nancyann BRAVO, MD as PCP - General (Family Medicine) [place], [facility] (Ophthalmology)   Medications: Outpatient Medications Prior to Visit  Medication Sig  . aspirin EC 81 MG tablet Take 81 mg by mouth daily. Swallow whole.  . Berberine Chloride (BERBERINE HCI PO) Take by mouth. Berberin and Cexlon Cinn 1200mg /100mg   . MAGNESIUM GLYCINATE PO Take 210 mg by mouth daily.  . Moringa Oleifera (MORINGA PO) Take 800 mg by mouth.  . Multiple Vitamins-Iron (MULTIVITAMIN/IRON PO) Take by mouth. Methylated  . OIL OF OREGANO PO Take by mouth. With Black Pepper  . Probiotic Product (PROBIOTIC PO) Take by mouth. SEED Probiotic  . Vitamin D-Vitamin K (VITAMIN K2-VITAMIN D3 PO) Take by mouth. 10000 international unit /200 mcg  . metFORMIN  (GLUCOPHAGE -XR) 500 MG 24 hr tablet Take 2 tablets (1,000 mg total) by mouth daily.  . Methylcobalamin (B12 SL) Place  under the tongue.  . Multiple Vitamin (MULTIVITAMIN) tablet Take 1 tablet by mouth daily.  . rosuvastatin  (CRESTOR ) 10 MG tablet Take 1 tablet (10 mg total) by mouth daily.  . Testosterone  40.5 MG/2.5GM (1.62%) GEL PLACE 2.5 GRAM(S) ONTO THE SKIN DAILY  . [DISCONTINUED] MAGNESIUM-OXIDE PO Take by mouth.   No facility-administered medications prior to visit.    Review of Systems  Constitutional:  Negative for appetite change, chills and fever.  Respiratory:  Negative for chest tightness, shortness of breath and wheezing.   Cardiovascular:  Negative for chest pain and palpitations.  Gastrointestinal:  Negative for abdominal pain, nausea and vomiting.  Objective    BP (!) 140/90 (BP Location: Left Arm, Patient Position: Sitting, Cuff Size: Normal)   Pulse 66   Resp 16   Ht 5' 9 (1.753 m)   Wt 194 lb 12.8 oz (88.4 kg)   SpO2 97%   BMI 28.77 kg/m    Physical Exam  General Appearance:    Well developed, well nourished male. Alert, cooperative, in no acute distress, appears stated age  Head:    Normocephalic, without obvious abnormality, atraumatic  Eyes:    PERRL, conjunctiva/corneas clear, EOM's intact, fundi    benign, both eyes       Ears:    Normal TM's and external ear canals, both ears  Nose:   Nares normal, septum midline, mucosa normal, no drainage   or sinus tenderness  Throat:   Lips, mucosa, and tongue normal; teeth and gums normal  Neck:   Supple, symmetrical, trachea midline, no adenopathy;       thyroid:  No enlargement/tenderness/nodules; no carotid   bruit or JVD  Back:     Symmetric, no curvature, ROM normal, no CVA tenderness  Lungs:     Clear to auscultation bilaterally, respirations unlabored  Chest wall:    No tenderness or deformity  Heart:    Normal heart rate. Normal rhythm. No murmurs, rubs, or gallops.  S1 and S2 normal  Abdomen:     Soft, non-tender, bowel sounds active all four quadrants,    no masses, no organomegaly  Genitalia:    deferred   Rectal:    deferred  Extremities:   All extremities are intact. No cyanosis or edema  Pulses:   2+ and symmetric all extremities  Skin:   Skin color, texture, turgor normal, no rashes or lesions  Lymph nodes:   Cervical, supraclavicular, and axillary nodes normal  Neurologic:   CNII-XII intact. Normal strength, sensation and reflexes      throughout       Last depression screening scores    11/05/2023    9:27 AM 05/06/2022    8:16 AM 02/04/2022    8:46 AM  PHQ 2/9 Scores  PHQ - 2 Score 0 0 0  PHQ- 9 Score 2 2 1    Last fall risk screening    11/05/2023    9:26 AM  Fall Risk   Falls in the past year? 0  Number falls in past yr: 0  Injury with Fall? 0  Risk for fall due to : No Fall Risks   Last Audit-C alcohol use screening    11/01/2023    5:28 PM  Alcohol Use Disorder Test (AUDIT)  1. How often do you have a drink containing alcohol? 0  3. How often do you have six or more drinks on one occasion? 0   A score of 3 or more in women, and 4 or more in men indicates increased risk for alcohol abuse, EXCEPT if all of the points are from question 1   No results found for any visits on 11/05/23.  Assessment & Plan    Routine Health Maintenance and Physical Exam  Exercise Activities and Dietary recommendations  Goals   None     Immunization History  Administered Date(s) Administered  . INFLUENZA, HIGH DOSE SEASONAL PF 10/14/2023  . Influenza,inj,Quad PF,6+ Mos 10/29/2019, 10/20/2021  . PNEUMOCOCCAL CONJUGATE-20 11/05/2023  . Tdap 09/02/2021  . Zoster Recombinant(Shingrix ) 09/02/2021, 11/03/2021  . Zoster, Live 01/22/2012    Health Maintenance  Topic Date Due  . COVID-19 Vaccine (  1) Never done  . HIV Screening  Never done  . HEMOGLOBIN A1C  11/05/2022  . Diabetic kidney evaluation - eGFR measurement  05/06/2023  . Diabetic kidney evaluation - Urine ACR  05/06/2023  . OPHTHALMOLOGY EXAM  10/05/2024  . Fecal DNA (Cologuard)  03/06/2025  . DTaP/Tdap/Td (2 - Td  or Tdap) 09/03/2031  . Pneumococcal Vaccine: 50+ Years  Completed  . Influenza Vaccine  Completed  . Hepatitis C Screening  Completed  . Zoster Vaccines- Shingrix   Completed  . Hepatitis B Vaccines 19-59 Average Risk  Aged Out  . Meningococcal B Vaccine  Aged Out  . Colonoscopy  Discontinued    Discussed health benefits of physical activity, and encouraged him to engage in regular exercise appropriate for his age and condition.     Adult Wellness Visit Routine wellness visit with no acute concerns. Reports feeling well with lifestyle changes. - Perform physical examination. - Order routine blood work.  Type 2 diabetes mellitus Managing diabetes with lifestyle changes. Not monitoring blood glucose at home. - Encourage continued lifestyle modifications. - Discuss the importance of monitoring blood glucose levels.  Essential hypertension Blood pressure slightly elevated. Does not monitor at home. - Recheck blood pressure before leaving the office. - Encourage regular home blood pressure monitoring.  Pure hypercholesterolemia Managing cholesterol with lifestyle changes. Coronary artery calcification noted previously. - Encourage continued lifestyle modifications.  Coronary artery calcification Elevated coronary artery calcification score. Discussed CT angiogram for further assessment. - Order CT angiogram to assess coronary artery calcification and thoracic aortic aneurysm.  Thoracic aortic aneurysm, without rupture 4 cm thoracic aortic aneurysm, asymptomatic. Last evaluated in 2024. CT angiogram discussed for growth assessment. - Order CT angiogram to assess thoracic aortic aneurysm.  Chronic right shoulder pain, likely rotator cuff tendinopathy Chronic shoulder pain with occasional numbness, likely rotator cuff tendinopathy. Exacerbated by activity. - Recommend stretching exercises and activity modification. Consider referral to orthopedics.  Sebaceous cyst of  neck Non-painful, stable sebaceous cyst on neck. No infection or complications. - Monitor for changes in size or symptoms.             Nancyann Perry, MD  [facility] [phone] (phone) [phone] (fax)  [facility]

## 2023-11-07 LAB — CBC WITH DIFFERENTIAL/PLATELET
Basophils Absolute: 0.1 x10E3/uL (ref 0.0–0.2)
Basos: 1 %
EOS (ABSOLUTE): 0.4 x10E3/uL (ref 0.0–0.4)
Eos: 5 %
Hematocrit: 47.8 % (ref 37.5–51.0)
Hemoglobin: 15.3 g/dL (ref 13.0–17.7)
Immature Grans (Abs): 0 x10E3/uL (ref 0.0–0.1)
Immature Granulocytes: 0 %
Lymphocytes Absolute: 1.5 x10E3/uL (ref 0.7–3.1)
Lymphs: 23 %
MCH: 26.4 pg — ABNORMAL LOW (ref 26.6–33.0)
MCHC: 32 g/dL (ref 31.5–35.7)
MCV: 82 fL (ref 79–97)
Monocytes Absolute: 0.6 x10E3/uL (ref 0.1–0.9)
Monocytes: 10 %
Neutrophils Absolute: 4 x10E3/uL (ref 1.4–7.0)
Neutrophils: 60 %
Platelets: 204 x10E3/uL (ref 150–450)
RBC: 5.8 x10E6/uL (ref 4.14–5.80)
RDW: 13.1 % (ref 11.6–15.4)
WBC: 6.6 x10E3/uL (ref 3.4–10.8)

## 2023-11-07 LAB — COMPREHENSIVE METABOLIC PANEL WITH GFR
ALT: 43 IU/L (ref 0–44)
AST: 34 IU/L (ref 0–40)
Albumin: 4.7 g/dL (ref 3.9–4.9)
Alkaline Phosphatase: 69 IU/L (ref 47–123)
BUN/Creatinine Ratio: 14 (ref 10–24)
BUN: 15 mg/dL (ref 8–27)
Bilirubin Total: 1.6 mg/dL — ABNORMAL HIGH (ref 0.0–1.2)
CO2: 23 mmol/L (ref 20–29)
Calcium: 9.6 mg/dL (ref 8.6–10.2)
Chloride: 101 mmol/L (ref 96–106)
Creatinine, Ser: 1.11 mg/dL (ref 0.76–1.27)
Globulin, Total: 2 g/dL (ref 1.5–4.5)
Glucose: 128 mg/dL — ABNORMAL HIGH (ref 70–99)
Potassium: 4.2 mmol/L (ref 3.5–5.2)
Sodium: 140 mmol/L (ref 134–144)
Total Protein: 6.7 g/dL (ref 6.0–8.5)
eGFR: 74 mL/min/1.73 (ref >59)

## 2023-11-07 LAB — TESTOSTERONE,FREE AND TOTAL
Testosterone, Free: 3.8 pg/mL — AB (ref 6.6–18.1)
Testosterone: 409 ng/dL (ref 264–916)

## 2023-11-07 LAB — LIPID PANEL
Chol/HDL Ratio: 4.3 ratio (ref 0.0–5.0)
Cholesterol, Total: 256 mg/dL — ABNORMAL HIGH (ref 100–199)
HDL: 60 mg/dL (ref >39)
LDL Chol Calc (NIH): 181 mg/dL — ABNORMAL HIGH (ref 0–99)
Triglycerides: 88 mg/dL (ref 0–149)
VLDL Cholesterol Cal: 15 mg/dL (ref 5–40)

## 2023-11-07 LAB — PSA TOTAL (REFLEX TO FREE): Prostate Specific Ag, Serum: 0.9 ng/mL (ref 0.0–4.0)

## 2023-11-07 LAB — TSH: TSH: 1.12 u[IU]/mL (ref 0.450–4.500)

## 2023-11-07 LAB — MICROALBUMIN / CREATININE URINE RATIO
Creatinine, Urine: 172.2 mg/dL
Microalb/Creat Ratio: 32 mg/g{creat} — ABNORMAL HIGH (ref 0–29)
Microalbumin, Urine: 55.9 ug/mL

## 2023-11-07 LAB — HEMOGLOBIN A1C
Est. average glucose Bld gHb Est-mCnc: 206 mg/dL
Hgb A1c MFr Bld: 8.8 % — ABNORMAL HIGH (ref 4.8–5.6)

## 2023-11-08 ENCOUNTER — Ambulatory Visit: Payer: Self-pay | Admitting: Family Medicine

## 2023-11-08 DIAGNOSIS — E119 Type 2 diabetes mellitus without complications: Secondary | ICD-10-CM

## 2023-11-08 DIAGNOSIS — E7800 Pure hypercholesterolemia, unspecified: Secondary | ICD-10-CM

## 2023-11-08 MED ORDER — METFORMIN HCL ER 500 MG PO TB24
1000.0000 mg | ORAL_TABLET | Freq: Every day | ORAL | 2 refills | Status: AC
Start: 2023-11-08 — End: ?

## 2023-11-08 MED ORDER — ROSUVASTATIN CALCIUM 10 MG PO TABS: 10.0000 mg | ORAL_TABLET | Freq: Every day | ORAL | 3 refills | Status: AC

## 2023-11-12 ENCOUNTER — Ambulatory Visit
Admission: RE | Admit: 2023-11-12 | Discharge: 2023-11-12 | Disposition: A | Discharge status: Home / Self Care | Source: Ambulatory Visit | Attending: Family Medicine | Admitting: Family Medicine

## 2023-11-12 DIAGNOSIS — I7121 Aneurysm of the ascending aorta, without rupture: Secondary | ICD-10-CM | POA: Diagnosis present

## 2023-11-12 MED ORDER — IOHEXOL 350 MG/ML SOLN
75.0000 mL | Freq: Once | INTRAVENOUS | Status: AC | PRN
  Administered 2023-11-12: 75 mL via INTRAVENOUS
